# Patient Record
Sex: Female | Born: 1980 | Race: White | Hispanic: No | Marital: Married | Smoking: Current every day smoker
Health system: Southern US, Community
[De-identification: ages and names within clinical notes are randomized; demographics above are authoritative.]

## PROBLEM LIST (undated history)

## (undated) DIAGNOSIS — O43129 Velamentous insertion of umbilical cord, unspecified trimester: Secondary | ICD-10-CM

## (undated) DIAGNOSIS — O24419 Gestational diabetes mellitus in pregnancy, unspecified control: Secondary | ICD-10-CM

## (undated) DIAGNOSIS — Z8619 Personal history of other infectious and parasitic diseases: Secondary | ICD-10-CM

## (undated) HISTORY — DX: Gestational diabetes mellitus in pregnancy, unspecified control: O24.419

## (undated) HISTORY — DX: Personal history of other infectious and parasitic diseases: Z86.19

## (undated) HISTORY — DX: Velamentous insertion of umbilical cord, unspecified trimester: O43.129

## (undated) HISTORY — PX: NO PAST SURGERIES: SHX2092

---

## 2004-10-12 ENCOUNTER — Ambulatory Visit (HOSPITAL_COMMUNITY): Admission: RE | Admit: 2004-10-12 | Discharge: 2004-10-12 | Payer: Self-pay | Admitting: Obstetrics & Gynecology

## 2004-10-12 ENCOUNTER — Ambulatory Visit: Payer: Self-pay | Admitting: Cardiology

## 2008-02-20 ENCOUNTER — Ambulatory Visit (HOSPITAL_COMMUNITY): Admission: RE | Admit: 2008-02-20 | Discharge: 2008-02-20 | Payer: Self-pay | Admitting: Obstetrics

## 2008-02-27 ENCOUNTER — Ambulatory Visit (HOSPITAL_COMMUNITY): Admission: RE | Admit: 2008-02-27 | Discharge: 2008-02-27 | Payer: Self-pay | Admitting: Obstetrics

## 2008-04-02 ENCOUNTER — Ambulatory Visit (HOSPITAL_COMMUNITY): Admission: RE | Admit: 2008-04-02 | Discharge: 2008-04-02 | Payer: Self-pay | Admitting: Obstetrics

## 2008-09-02 ENCOUNTER — Ambulatory Visit: Payer: Self-pay | Admitting: Vascular Surgery

## 2008-09-02 ENCOUNTER — Encounter (INDEPENDENT_AMBULATORY_CARE_PROVIDER_SITE_OTHER): Payer: Self-pay | Admitting: Obstetrics

## 2008-09-02 ENCOUNTER — Ambulatory Visit: Admission: RE | Admit: 2008-09-02 | Discharge: 2008-09-02 | Payer: Self-pay | Admitting: Obstetrics

## 2008-09-16 ENCOUNTER — Inpatient Hospital Stay (HOSPITAL_COMMUNITY): Admission: AD | Admit: 2008-09-16 | Discharge: 2008-09-18 | Payer: Self-pay | Admitting: Obstetrics

## 2010-03-15 IMAGING — US US OB NUCHAL TRANSLUCENCY 1ST GEST
1 series · 11 of 11 positions shown · non-contrast
Comparison: none

OBSTETRICAL ULTRASOUND:
 This ultrasound was performed in The [HOSPITAL], and the [facility] report will be stored to [REDACTED] PACS.

[Series 1: us ob nuchal translucency 1st gest · 11 of 11 slices shown]
[im 1/11]
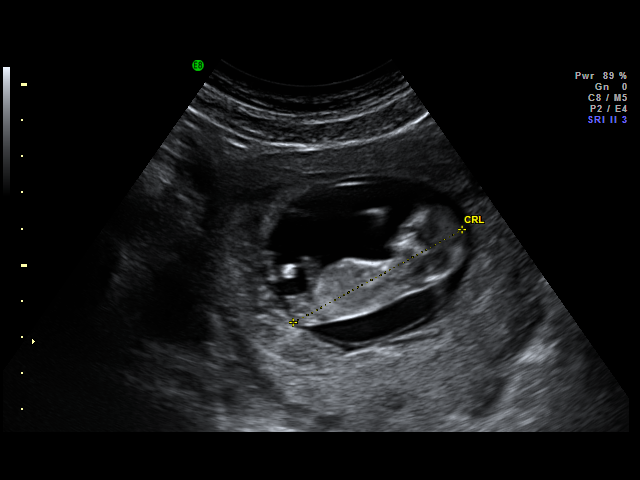
[im 2/11]
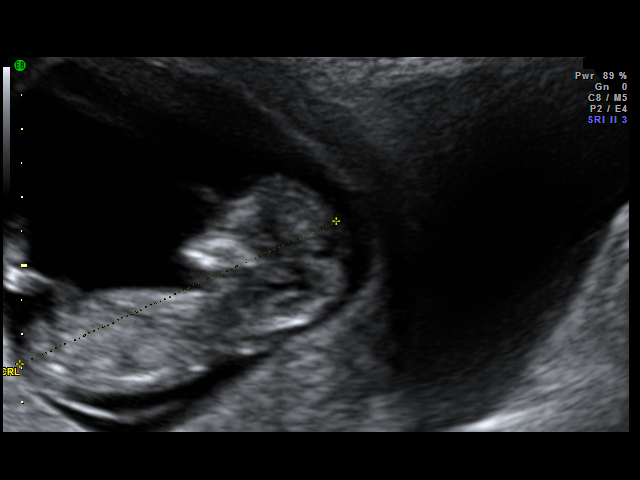
[im 3/11]
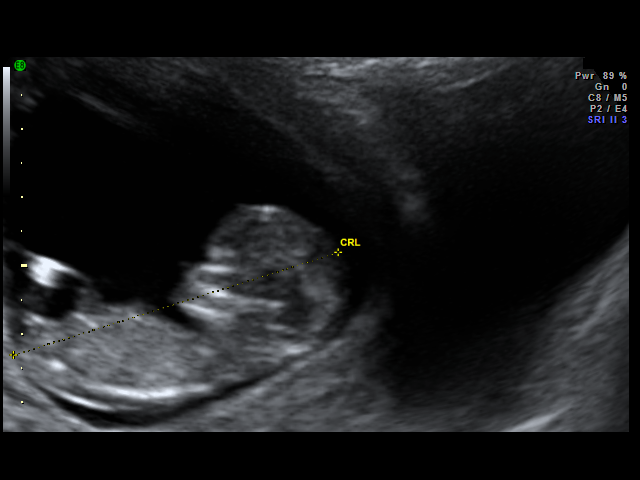
[im 4/11]
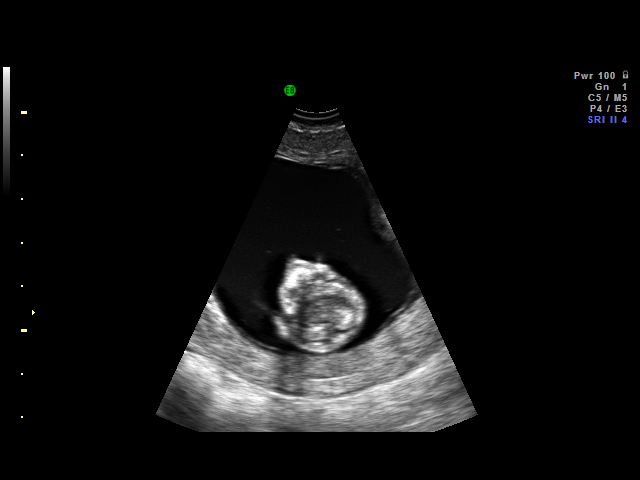
[im 5/11]
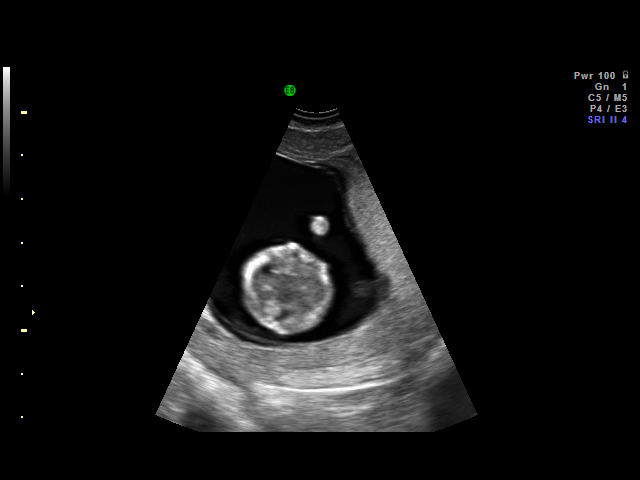
[im 6/11]
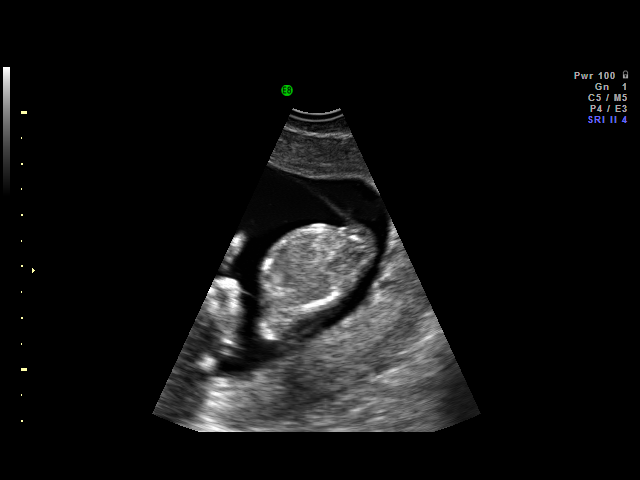
[im 7/11]
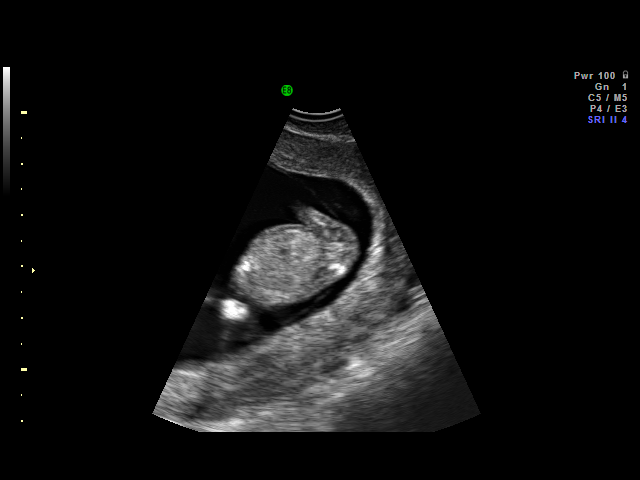
[im 8/11]
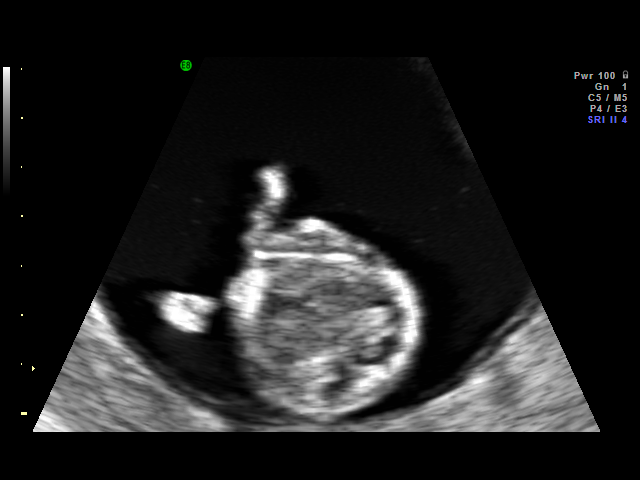
[im 9/11]
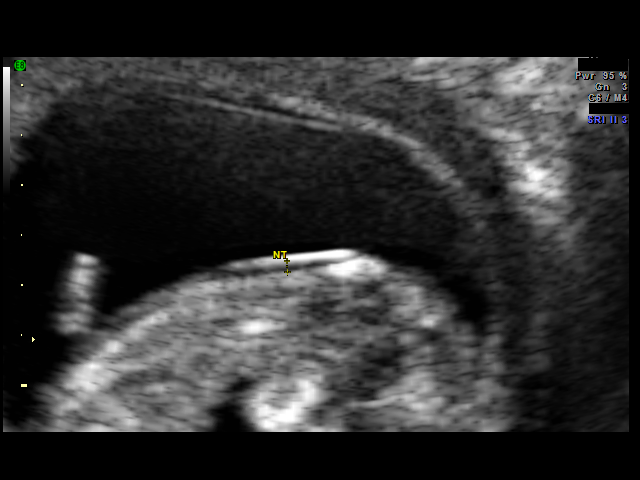
[im 10/11]
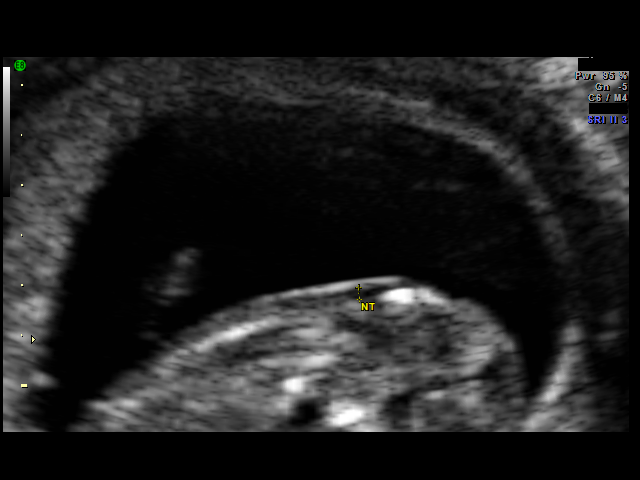
[im 11/11]
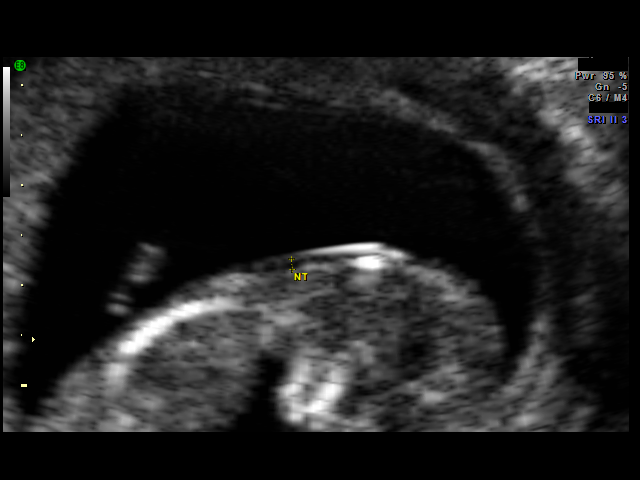

[11 of 11 positions shown; findings below may reference images not displayed]

IMPRESSION: [facility] has also been faxed to the ordering physician.

## 2010-04-17 LAB — CBC
Hemoglobin: 10.9 g/dL — ABNORMAL LOW (ref 12.0–15.0)
MCV: 99.6 fL (ref 78.0–100.0)
Platelets: 172 10*3/uL (ref 150–400)
Platelets: 199 10*3/uL (ref 150–400)
RDW: 12.6 % (ref 11.5–15.5)
RDW: 13.3 % (ref 11.5–15.5)
WBC: 10.8 10*3/uL — ABNORMAL HIGH (ref 4.0–10.5)

## 2010-04-17 LAB — RPR: RPR Ser Ql: NONREACTIVE

## 2010-05-29 NOTE — Procedures (Signed)
NAME:  Kathryn Oconnor, Kathryn Oconnor            ACCOUNT NO.:  [id number]   MEDICAL RECORD NO.:  [id number]          PATIENT TYPE:  OUT   LOCATION:  RAD                           FACILITY:  [facility]   PHYSICIAN:  [place] Bing, M.D. [facility] OF BIRTH:  02/21/1980   DATE OF PROCEDURE:  10/12/2004  DATE OF DISCHARGE:                                  ECHOCARDIOGRAM   CLINICAL DATA:  30 year old with murmur.  M-mode aorta 2.1, left atrium 2.5,  septum 0.9, posterior wall 0.7, LV diastole 3.7, LV systole 2.8.  1.  Technically adequate echocardiographic study.  2.  Normal left atrium, right atrium, and right ventricle.  3.  Normal aortic, mitral, tricuspid, and pulmonic valves.  4.  Normal Doppler study with physiologic tricuspid regurgitation and normal      estimated capital RV systolic pressure.  5.  Normal proximal pulmonary artery.  6.  Normal capital IVC.  7.  Normal internal dimension, wall thickness, regional and global function      of the left ventricle.      [place] Bing, M.D. [facility]  Electronically Signed     RR/MEDQ  D:  10/12/2004  T:  10/12/2004  Job:  [phone]

## 2012-11-16 ENCOUNTER — Other Ambulatory Visit: Payer: Self-pay | Admitting: Nurse Practitioner

## 2012-11-16 ENCOUNTER — Telehealth: Payer: Self-pay | Admitting: Nurse Practitioner

## 2012-11-16 MED ORDER — SULFACETAMIDE SODIUM 10 % OP SOLN: 1.0000 [drp] | OPHTHALMIC | Status: DC

## 2012-11-16 NOTE — Telephone Encounter (Signed)
Discussed with patient

## 2012-11-16 NOTE — Telephone Encounter (Signed)
Med called in. Office visit if worsens or persists.

## 2012-11-16 NOTE — Telephone Encounter (Signed)
Patient needs something called in for pink eye ° °[facility] °

## 2013-01-24 ENCOUNTER — Ambulatory Visit (INDEPENDENT_AMBULATORY_CARE_PROVIDER_SITE_OTHER): Payer: 59 | Admitting: Family Medicine

## 2013-01-24 ENCOUNTER — Encounter: Payer: Self-pay | Admitting: Family Medicine

## 2013-01-24 VITALS — Temp 98.0°F | Ht 63.0 in | Wt 131.0 lb

## 2013-01-24 DIAGNOSIS — J310 Chronic rhinitis: Secondary | ICD-10-CM

## 2013-01-24 DIAGNOSIS — J329 Chronic sinusitis, unspecified: Secondary | ICD-10-CM

## 2013-01-24 DIAGNOSIS — J029 Acute pharyngitis, unspecified: Secondary | ICD-10-CM

## 2013-01-24 MED ORDER — AZITHROMYCIN 250 MG PO TABS: ORAL_TABLET | ORAL | Status: DC

## 2013-01-24 NOTE — Progress Notes (Signed)
   Subjective:    Patient ID: Fransisca Kaufmannrystal Pascuzzi, female    DOB: Mar 05, 1980, 33 y.o.   MRN: [id number]  Fever  This is a new problem. The current episode started today. Associated symptoms include congestion and a sore throat.   Throat tend and sore, slight cough  Not every day  Had a headache today Headache frontal in nature comes and goes.   Flu shot this yr    Review of Systems  Constitutional: Positive for fever.  HENT: Positive for congestion and sore throat.    No vomiting no diarrhea no rash some with recent virus.   ROS otherwise negative Objective:   Physical Exam  Alert mild malaise. Frontal maxillary tenderness pharynx erythematous. Neck supple. Lungs clear. Heart regular in rhythm.      Assessment & Plan:  Impression acute rhinosinusitis plan Zithromax. Symptomatic care discussed. Warning signs discussed. Possible this could be first day of flu, educated on progression of symptoms. WSL

## 2013-01-25 LAB — STREP A DNA PROBE: GASP: NEGATIVE

## 2014-01-11 NOTE — L&D Delivery Note (Signed)
Delivery Note At 4:03 AM a viable and healthy female was delivered via Vaginal, Spontaneous Delivery (Presentation: ;  ).  APGAR: 8, 9; weight  pending.   Placenta status: Intact, Spontaneous.  Cord: 3 vessels with the following complications:30% abruption .  Cord pH: nROA  Anesthesia: Epidural  Episiotomy: na  Lacerations:  second Suture Repair: 2.0 rapide Est. Blood Loss (mL):100    Mom to postpartum.  Baby to Couplet care / Skin to Skin.  Avir Deruiter J 12/22/2014, 4:18 AM

## 2014-02-06 ENCOUNTER — Telehealth: Payer: Self-pay | Admitting: *Deleted

## 2014-02-06 ENCOUNTER — Encounter: Payer: Self-pay | Admitting: Family Medicine

## 2014-02-06 ENCOUNTER — Ambulatory Visit (INDEPENDENT_AMBULATORY_CARE_PROVIDER_SITE_OTHER): Payer: BLUE CROSS/BLUE SHIELD | Admitting: Family Medicine

## 2014-02-06 ENCOUNTER — Ambulatory Visit (HOSPITAL_COMMUNITY)
Admission: RE | Admit: 2014-02-06 | Discharge: 2014-02-06 | Disposition: A | Discharge status: Home / Self Care | Payer: BLUE CROSS/BLUE SHIELD | Source: Ambulatory Visit | Attending: Family Medicine | Admitting: Family Medicine

## 2014-02-06 VITALS — BP 112/80 | Temp 98.6°F | Ht 63.0 in | Wt 128.0 lb

## 2014-02-06 DIAGNOSIS — R079 Chest pain, unspecified: Secondary | ICD-10-CM

## 2014-02-06 DIAGNOSIS — F17200 Nicotine dependence, unspecified, uncomplicated: Secondary | ICD-10-CM | POA: Diagnosis not present

## 2014-02-06 NOTE — Telephone Encounter (Signed)
Left message stating chest xray put in at [facility] d/t chest pain per Dr. Brett CanalesSteve

## 2014-02-06 NOTE — Progress Notes (Signed)
   Subjective:    Patient ID: Kathryn Kaufmannrystal Belgarde, female    DOB: [birth year], 34 y.o.   MRN: [id number]  Chest Pain  This is a new problem. Episode onset: 2 weeks ago. The onset quality is sudden. The problem occurs every several days. The problem has been gradually worsening. The pain is present in the substernal region. The quality of the pain is described as sharp. The pain does not radiate. Associated symptoms include dizziness and near-syncope. The pain is aggravated by nothing. She has tried antacids for the symptoms. The treatment provided mild relief. Risk factors include smoking/tobacco exposure. Past medical history comments: heart mumur   Her family medical history is significant for heart disease.    substernal c p, deep in chest,   No recent bout   No recent injury  [id number] Sleeping ok thru the night  No hx of fainting  Spells  Smokes one ppd on ave  No sig fam h of premature cad, definitely cad in the family. Fa had heart attack recently   Patient does not exercise at all. So unable to ask about exertional association.  No nausea no diaphoresis questionable shortness of breath.  Some nonspecific lightheadedness Review of Systems  Cardiovascular: Positive for chest pain and near-syncope.  Neurological: Positive for dizziness.       Objective:   Physical Exam  Alert no acute distress. Vitals stable. HEENT normal. Lungs clear. Heart regular rate and rhythm. Abdomen benign. Chest wall no obvious tenderness.  EKG normal sinus rhythm no significant ST-T changes    Assessment & Plan:  Impression #1 chest pain, very atypical in nature with considerable anxiety upon the patient's part. Plan trial of Aleve 2 tablets twice a day. Chest x-ray. Cardiology referral. WSL

## 2014-02-06 NOTE — Addendum Note (Signed)
Addended byOneal Deputy: Courtnei Ruddell D on: 02/06/2014 01:46 PM   Modules accepted: Orders

## 2014-02-13 ENCOUNTER — Encounter: Payer: Self-pay | Admitting: Cardiology

## 2014-02-13 ENCOUNTER — Ambulatory Visit (INDEPENDENT_AMBULATORY_CARE_PROVIDER_SITE_OTHER): Payer: BLUE CROSS/BLUE SHIELD | Admitting: Cardiology

## 2014-02-13 VITALS — BP 116/64 | HR 103 | Ht 63.0 in | Wt 126.0 lb

## 2014-02-13 DIAGNOSIS — Z720 Tobacco use: Secondary | ICD-10-CM

## 2014-02-13 DIAGNOSIS — R0789 Other chest pain: Secondary | ICD-10-CM | POA: Insufficient documentation

## 2014-02-13 NOTE — Assessment & Plan Note (Signed)
Smoking cessation recommended 

## 2014-02-13 NOTE — Patient Instructions (Signed)
Your physician recommends that you schedule a follow-up appointment as needed.  Your physician recommends that you continue on your current medications as directed. Please refer to the Current Medication list given to you today.   Thank you for choosing [facility]!!   

## 2014-02-13 NOTE — Assessment & Plan Note (Signed)
Symptoms are potentially indigestion based on description, possibly even esophagitis given more prolonged nature and improvement with antacids. I have recommended that she try an over-the-counter medication such as Prilosec or Pepcid for the next 2 weeks to see if this helps symptoms to completely resolved. Her baseline cardiac risk factor profile is relatively low, her main personal risk being tobacco use. Smoking cessation is warranted. Her recent ECG was essentially normal, and previous echocardiographic assessment of heart murmur revealed no valvular abnormalities. At this point would hold off on any further cardiac ischemic testing since this diagnosis would be relatively unlikely at this time. If symptoms worsen, could always consider a GXT.

## 2014-02-13 NOTE — Progress Notes (Signed)
Cardiology Office Note  Date: 02/13/2014   ID: Fransisca Kaufmann, DOB Feb 07, 1980, MRN [id number]  PCP: Harlow Asa, MD  Primary Cardiologist: Nona Dell, MD   Chief Complaint  Patient presents with  . Chest Pain    History of Present Illness: Kathryn Oconnor is a 34 y.o. female referred for cardiology consultation by Dr. Gerda Diss. She tells me that she has had 2 episodes of a dull chest discomfort, felt that it was likely indigestion. One of these episodes lasted for several hours and was more throbbing. She woke up early morning with these symptoms, drank milk and took Tums, and eventually the symptoms resolved. She has not had any previous problems with indigestion, and does not take an antacid regularly. Symptoms have not been exertional. She has been functional in her typical ADLs, works in an assisted-living environment.  She has no major personal cardiac risk factors other than history of tobacco use. Family history includes heart disease in her father in his 27s, also in a maternal uncle in his 41s.  She reports being told that she had a heart murmur by her gynecologist. several years ago and did have an echocardiogram in 2006. The study reported normal left and right ventricular function with no major valvular abnormalities.  Recent ECG noted below.  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 She states that her routine has been altered, was wondering whether her symptoms might also be related.   History reviewed. No pertinent past medical history.  History reviewed. No pertinent past surgical history.  Current Outpatient Prescriptions  Medication Sig Dispense Refill  . naproxen sodium (ANAPROX) 220 MG tablet Take 220 mg by mouth as needed.     No current facility-administered medications for this visit.    Allergies:  Review of patient's allergies indicates  no known allergies.   Social History: The patient  reports that she has been smoking Cigarettes.  She started smoking about 17 years ago. She has been smoking about 1.00 pack per day. She has never used smokeless tobacco. She reports that she does not drink alcohol or use illicit drugs.   Family History: The patient's family history includes CAD in her father and maternal uncle.   ROS:  Please see the history of present illness. Otherwise, complete review of systems is positive for none.  All other systems are reviewed and negative.    PHYSICAL EXAM: VS:  BP 116/64 mmHg  Pulse 103  Ht  (1.6 m)  Wt 126 lb (57.153 kg)  BMI 22.33 kg/m2  SpO2 97%  LMP 01/22/2014, BMI Body mass index is 22.33 kg/(m^2).  Wt Readings from Last 3 Encounters:  02/13/14 126 lb (57.153 kg)  02/06/14 128 lb (58.06 kg)  01/24/13 131 lb (59.421 kg)    Normally nourished appearing young woman in no distress. HEENT: Conjunctiva and lids normal, oropharynx clear with moist mucosa. Neck: Supple, no elevated JVP or carotid bruits, no thyromegaly. Lungs: Clear to auscultation, nonlabored breathing at rest. Cardiac: Regular rate and rhythm, no S3, soft systolic murmur, no pericardial rub. Abdomen: Soft, nontender, bowel sounds present. Extremities: No pitting edema, distal pulses 2+. Skin: Warm and dry. Musculoskeletal: No kyphosis. Neuropsychiatric: Alert and oriented x3, affect grossly appropriate.   ECG: ECG is not ordered today demonstrates  Recent Labwork: None  Other Studies Reviewed Today:  1. Recent ECG from  January 2016 reviewed finding normal sinus rhythm.  2. EXAM: CHEST 2 VIEW 02/07/2014  COMPARISON: None.  FINDINGS: Cardiomediastinal silhouette is unremarkable. Lower thoracic dextroscoliosis. No acute infiltrate or pleural effusion. No pulmonary edema. Mild hyperinflation.  IMPRESSION: No active disease. Mild hyperinflation. Lower thoracic Mild Dextroscoliosis.  3.  Echocardiogram 10/12/2004 CLINICAL DATA: 34 year old with murmur. M-mode aorta 2.1, left atrium 2.5, septum 0.9, posterior wall 0.7, LV diastole 3.7, LV systole 2.8. 1. Technically adequate echocardiographic study. 2. Normal left atrium, right atrium, and right ventricle. 3. Normal aortic, mitral, tricuspid, and pulmonic valves. 4. Normal Doppler study with physiologic tricuspid regurgitation and normal  estimated capital RV systolic pressure. 5. Normal proximal pulmonary artery. 6. Normal capital IVC. 7. Normal internal dimension, wall thickness, regional and global function  of the left ventricle.   ASSESSMENT AND PLAN:  Atypical chest pain Symptoms are potentially indigestion based on description, possibly even esophagitis given more prolonged nature and improvement with antacids. I have recommended that she try an over-the-counter medication such as Prilosec or Pepcid for the next 2 weeks to see if this helps symptoms to completely resolved. Her baseline cardiac risk factor profile is relatively low, her main personal risk being tobacco use. Smoking cessation is warranted. Her recent ECG was essentially normal, and previous echocardiographic assessment of heart murmur revealed no valvular abnormalities. At this point would hold off on any further cardiac ischemic testing since this diagnosis would be relatively unlikely at this time. If symptoms worsen, could always consider a GXT.   Tobacco abuse Smoking cessation recommended.     Current medicines are reviewed at length with the patient today.  The patient does not have concerns regarding medicines.   Disposition: FU as needed.  Signed, Jonelle SidleSamuel G. McDowell, MD, [facility]ACC 02/13/2014 2:06 PM    [facility] [address], [place], [place] [address] Phone: [phone]) [phone]; Fax: [phone]) [phone]

## 2014-02-15 ENCOUNTER — Encounter: Payer: Self-pay | Admitting: Cardiology

## 2014-02-15 ENCOUNTER — Encounter: Payer: Self-pay | Admitting: Family Medicine

## 2014-05-23 LAB — OB RESULTS CONSOLE ABO/RH: RH Type: POSITIVE

## 2014-05-23 LAB — OB RESULTS CONSOLE GC/CHLAMYDIA
CHLAMYDIA, DNA PROBE: NEGATIVE
Gonorrhea: NEGATIVE

## 2014-05-23 LAB — OB RESULTS CONSOLE RUBELLA ANTIBODY, IGM: RUBELLA: IMMUNE

## 2014-05-23 LAB — OB RESULTS CONSOLE ANTIBODY SCREEN: Antibody Screen: NEGATIVE

## 2014-05-23 LAB — OB RESULTS CONSOLE RPR: RPR: NONREACTIVE

## 2014-05-23 LAB — OB RESULTS CONSOLE HIV ANTIBODY (ROUTINE TESTING): HIV: NONREACTIVE

## 2014-05-23 LAB — OB RESULTS CONSOLE HEPATITIS B SURFACE ANTIGEN: HEP B S AG: NEGATIVE

## 2014-10-23 ENCOUNTER — Encounter: Payer: BLUE CROSS/BLUE SHIELD | Attending: Obstetrics

## 2014-10-23 VITALS — Ht 63.0 in | Wt 138.3 lb

## 2014-10-23 DIAGNOSIS — Z713 Dietary counseling and surveillance: Secondary | ICD-10-CM | POA: Diagnosis not present

## 2014-10-23 DIAGNOSIS — O24419 Gestational diabetes mellitus in pregnancy, unspecified control: Secondary | ICD-10-CM | POA: Insufficient documentation

## 2014-10-29 NOTE — Progress Notes (Signed)
  Patient was seen on 10/23/14 for Gestational Diabetes self-management . The following learning objectives were met by the patient :   States the definition of Gestational Diabetes  States why dietary management is important in controlling blood glucose  Describes the effects of carbohydrates on blood glucose levels  Demonstrates ability to create a balanced meal plan  Demonstrates carbohydrate counting   States when to check blood glucose levels  Demonstrates proper blood glucose monitoring techniques  States the effect of stress and exercise on blood glucose levels  States the importance of limiting caffeine and abstaining from alcohol and smoking  Plan:  Aim for 2 Carb Choices per meal (30 grams) +/- 1 either way for breakfast Aim for 3 Carb Choices per meal (45 grams) +/- 1 either way from lunch and dinner Aim for 1-2 Carbs per snack Begin reading food labels for Total Carbohydrate and sugar grams of foods Consider  increasing your activity level by walking daily as tolerated Begin checking BG before breakfast and 1-2 hours after first bit of breakfast, lunch and dinner after  as directed by MD  Take medication  as directed by MD  Blood glucose monitor given: Testing per MD office  Patient instructed to monitor glucose levels: FBS: 60 - <90 1 hour: <140 2 hour: <120  Patient received the following handouts:  Nutrition Diabetes and Pregnancy  Carbohydrate Counting List  Meal Planning worksheet  Patient will be seen for follow-up as needed.

## 2014-11-25 ENCOUNTER — Ambulatory Visit (INDEPENDENT_AMBULATORY_CARE_PROVIDER_SITE_OTHER): Payer: BLUE CROSS/BLUE SHIELD | Admitting: Family Medicine

## 2014-11-25 ENCOUNTER — Encounter: Payer: Self-pay | Admitting: Family Medicine

## 2014-11-25 VITALS — Temp 98.6°F | Ht 63.0 in | Wt 140.0 lb

## 2014-11-25 DIAGNOSIS — J019 Acute sinusitis, unspecified: Secondary | ICD-10-CM | POA: Diagnosis not present

## 2014-11-25 DIAGNOSIS — B9689 Other specified bacterial agents as the cause of diseases classified elsewhere: Secondary | ICD-10-CM

## 2014-11-25 MED ORDER — AMOXICILLIN 500 MG PO TABS: 500.0000 mg | ORAL_TABLET | Freq: Three times a day (TID) | ORAL | Status: DC

## 2014-11-25 NOTE — Progress Notes (Signed)
   Subjective:    Patient ID: Kathryn Oconnor, female    DOB: 1980-11-13, 34 y.o.   MRN: [id number]  Pt is [redacted] weeks pregnant.  Sore Throat  This is a new problem. Episode onset: 3 days ago. Associated symptoms include congestion, coughing and ear pain. Pertinent negatives include no shortness of breath. Associated symptoms comments: Ear pain, runny nose. Treatments tried: mucinex, chloreseptic, cough drops.    Patient with head congestion drainage coughing sinus pressure not feeling good symptoms been present over the course of the past several days worse over the past few days. PMH benign  Review of Systems  Constitutional: Negative for fever and activity change.  HENT: Positive for congestion, ear pain and rhinorrhea.   Eyes: Negative for discharge.  Respiratory: Positive for cough. Negative for shortness of breath and wheezing.   Cardiovascular: Negative for chest pain.       Objective:   Physical Exam  Constitutional: She appears well-developed.  HENT:  Head: Normocephalic.  Nose: Nose normal.  Mouth/Throat: Oropharynx is clear and moist. No oropharyngeal exudate.  Neck: Neck supple.  Cardiovascular: Normal rate and normal heart sounds.   No murmur heard. Pulmonary/Chest: Effort normal and breath sounds normal. She has no wheezes.  Lymphadenopathy:    She has no cervical adenopathy.  Skin: Skin is warm and dry.  Nursing note and vitals reviewed.         Assessment & Plan:  Viral syndrome secondary rhinosinusitis antibiotics prescribed. Patient is [redacted] weeks pregnant but I find no evidence of any type underlying issue complicating this from her infection. She was told if she starts running high fevers excessive vomiting or getting worse she would need immediate recheck here or ER. She should gradually get better over the course of next several days

## 2014-12-09 LAB — OB RESULTS CONSOLE GBS: GBS: POSITIVE

## 2014-12-18 ENCOUNTER — Encounter (HOSPITAL_COMMUNITY): Payer: Self-pay | Admitting: *Deleted

## 2014-12-18 ENCOUNTER — Telehealth (HOSPITAL_COMMUNITY): Payer: Self-pay | Admitting: *Deleted

## 2014-12-18 NOTE — Telephone Encounter (Signed)
Preadmission screen  

## 2014-12-19 ENCOUNTER — Other Ambulatory Visit: Payer: Self-pay | Admitting: Obstetrics

## 2014-12-22 ENCOUNTER — Inpatient Hospital Stay (HOSPITAL_COMMUNITY): Payer: BLUE CROSS/BLUE SHIELD | Admitting: Anesthesiology

## 2014-12-22 ENCOUNTER — Inpatient Hospital Stay (HOSPITAL_COMMUNITY)
Admission: AD | Admit: 2014-12-22 | Discharge: 2014-12-24 | DRG: 775 | Disposition: A | Discharge status: Home / Self Care | Payer: BLUE CROSS/BLUE SHIELD | Source: Ambulatory Visit | Attending: Obstetrics and Gynecology | Admitting: Obstetrics and Gynecology

## 2014-12-22 ENCOUNTER — Encounter (HOSPITAL_COMMUNITY): Payer: Self-pay | Admitting: *Deleted

## 2014-12-22 DIAGNOSIS — K219 Gastro-esophageal reflux disease without esophagitis: Secondary | ICD-10-CM | POA: Diagnosis present

## 2014-12-22 DIAGNOSIS — Z3A39 39 weeks gestation of pregnancy: Secondary | ICD-10-CM | POA: Diagnosis not present

## 2014-12-22 DIAGNOSIS — O99824 Streptococcus B carrier state complicating childbirth: Secondary | ICD-10-CM | POA: Diagnosis present

## 2014-12-22 DIAGNOSIS — Z8249 Family history of ischemic heart disease and other diseases of the circulatory system: Secondary | ICD-10-CM

## 2014-12-22 DIAGNOSIS — Z818 Family history of other mental and behavioral disorders: Secondary | ICD-10-CM

## 2014-12-22 DIAGNOSIS — O24429 Gestational diabetes mellitus in childbirth, unspecified control: Secondary | ICD-10-CM | POA: Diagnosis present

## 2014-12-22 DIAGNOSIS — O9962 Diseases of the digestive system complicating childbirth: Secondary | ICD-10-CM | POA: Diagnosis present

## 2014-12-22 DIAGNOSIS — O99334 Smoking (tobacco) complicating childbirth: Secondary | ICD-10-CM | POA: Diagnosis present

## 2014-12-22 DIAGNOSIS — F17210 Nicotine dependence, cigarettes, uncomplicated: Secondary | ICD-10-CM | POA: Diagnosis present

## 2014-12-22 LAB — CBC
HCT: 38.3 % (ref 36.0–46.0)
HCT: 34.7 % — ABNORMAL LOW (ref 36.0–46.0)
Hemoglobin: 13 g/dL (ref 12.0–15.0)
Hemoglobin: 11.7 g/dL — ABNORMAL LOW (ref 12.0–15.0)
MCH: 31.6 pg (ref 26.0–34.0)
MCH: 31.5 pg (ref 26.0–34.0)
MCHC: 33.9 g/dL (ref 30.0–36.0)
MCHC: 33.7 g/dL (ref 30.0–36.0)
MCV: 93.2 fL (ref 78.0–100.0)
MCV: 93.5 fL (ref 78.0–100.0)
PLATELETS: 261 10*3/uL (ref 150–400)
Platelets: 210 10*3/uL (ref 150–400)
RBC: 4.11 MIL/uL (ref 3.87–5.11)
RBC: 3.71 MIL/uL — ABNORMAL LOW (ref 3.87–5.11)
RDW: 13.7 % (ref 11.5–15.5)
RDW: 13.7 % (ref 11.5–15.5)
WBC: 13.9 10*3/uL — ABNORMAL HIGH (ref 4.0–10.5)
WBC: 16.4 10*3/uL — ABNORMAL HIGH (ref 4.0–10.5)

## 2014-12-22 LAB — RPR: RPR Ser Ql: NONREACTIVE

## 2014-12-22 LAB — ABO/RH: ABO/RH(D): A POS

## 2014-12-22 LAB — TYPE AND SCREEN
ABO/RH(D): A POS
Antibody Screen: NEGATIVE

## 2014-12-22 MED ORDER — METHYLERGONOVINE MALEATE 0.2 MG PO TABS: 0.2000 mg | ORAL_TABLET | ORAL | Status: DC | PRN

## 2014-12-22 MED ORDER — PRENATAL MULTIVITAMIN CH
1.0000 | ORAL_TABLET | Freq: Every day | ORAL | Status: DC
  Administered 2014-12-22 – 2014-12-23 (×2): 1 via ORAL
  Filled 2014-12-22 (×2): qty 1

## 2014-12-22 MED ORDER — IBUPROFEN 600 MG PO TABS
600.0000 mg | ORAL_TABLET | Freq: Four times a day (QID) | ORAL | Status: DC
  Administered 2014-12-22 – 2014-12-24 (×9): 600 mg via ORAL
  Filled 2014-12-22 (×9): qty 1

## 2014-12-22 MED ORDER — PHENYLEPHRINE 40 MCG/ML (10ML) SYRINGE FOR IV PUSH (FOR BLOOD PRESSURE SUPPORT)
80.0000 ug | PREFILLED_SYRINGE | INTRAVENOUS | Status: DC | PRN
  Filled 2014-12-22: qty 2

## 2014-12-22 MED ORDER — BENZOCAINE-MENTHOL 20-0.5 % EX AERO
1.0000 "application " | INHALATION_SPRAY | CUTANEOUS | Status: DC | PRN
  Administered 2014-12-23: 1 via TOPICAL
  Filled 2014-12-22: qty 56

## 2014-12-22 MED ORDER — DIBUCAINE 1 % RE OINT: 1.0000 "application " | TOPICAL_OINTMENT | RECTAL | Status: DC | PRN

## 2014-12-22 MED ORDER — ACETAMINOPHEN 325 MG PO TABS: 650.0000 mg | ORAL_TABLET | ORAL | Status: DC | PRN

## 2014-12-22 MED ORDER — CITRIC ACID-SODIUM CITRATE 334-500 MG/5ML PO SOLN: 30.0000 mL | ORAL | Status: DC | PRN

## 2014-12-22 MED ORDER — FLEET ENEMA 7-19 GM/118ML RE ENEM: 1.0000 | ENEMA | RECTAL | Status: DC | PRN

## 2014-12-22 MED ORDER — ONDANSETRON HCL 4 MG/2ML IJ SOLN: 4.0000 mg | INTRAMUSCULAR | Status: DC | PRN

## 2014-12-22 MED ORDER — METHYLERGONOVINE MALEATE 0.2 MG/ML IJ SOLN: 0.2000 mg | INTRAMUSCULAR | Status: DC | PRN

## 2014-12-22 MED ORDER — FENTANYL 2.5 MCG/ML BUPIVACAINE 1/10 % EPIDURAL INFUSION (WH - ANES)
INTRAMUSCULAR | Status: AC
  Filled 2014-12-22: qty 125

## 2014-12-22 MED ORDER — LACTATED RINGERS IV SOLN
500.0000 mL | INTRAVENOUS | Status: DC | PRN
  Administered 2014-12-22: 500 mL via INTRAVENOUS

## 2014-12-22 MED ORDER — PHENYLEPHRINE 40 MCG/ML (10ML) SYRINGE FOR IV PUSH (FOR BLOOD PRESSURE SUPPORT)
PREFILLED_SYRINGE | INTRAVENOUS | Status: AC
  Filled 2014-12-22: qty 20

## 2014-12-22 MED ORDER — LIDOCAINE HCL (PF) 1 % IJ SOLN
INTRAMUSCULAR | Status: DC | PRN
  Administered 2014-12-22 (×2): 4 mL via EPIDURAL

## 2014-12-22 MED ORDER — SODIUM CHLORIDE 0.9 % IV SOLN
2.0000 g | Freq: Once | INTRAVENOUS | Status: AC
  Administered 2014-12-22: 2 g via INTRAVENOUS
  Filled 2014-12-22: qty 2000

## 2014-12-22 MED ORDER — OXYCODONE-ACETAMINOPHEN 5-325 MG PO TABS: 2.0000 | ORAL_TABLET | ORAL | Status: DC | PRN

## 2014-12-22 MED ORDER — OXYTOCIN 40 UNITS IN LACTATED RINGERS INFUSION - SIMPLE MED
62.5000 mL/h | INTRAVENOUS | Status: DC
  Administered 2014-12-22: 62.5 mL/h via INTRAVENOUS
  Filled 2014-12-22: qty 1000

## 2014-12-22 MED ORDER — DIPHENHYDRAMINE HCL 25 MG PO CAPS: 25.0000 mg | ORAL_CAPSULE | Freq: Four times a day (QID) | ORAL | Status: DC | PRN

## 2014-12-22 MED ORDER — OXYTOCIN BOLUS FROM INFUSION: 500.0000 mL | INTRAVENOUS | Status: DC

## 2014-12-22 MED ORDER — TETANUS-DIPHTH-ACELL PERTUSSIS 5-2.5-18.5 LF-MCG/0.5 IM SUSP: 0.5000 mL | Freq: Once | INTRAMUSCULAR | Status: DC

## 2014-12-22 MED ORDER — ONDANSETRON HCL 4 MG/2ML IJ SOLN: 4.0000 mg | Freq: Four times a day (QID) | INTRAMUSCULAR | Status: DC | PRN

## 2014-12-22 MED ORDER — LACTATED RINGERS IV SOLN: INTRAVENOUS | Status: DC

## 2014-12-22 MED ORDER — EPHEDRINE 5 MG/ML INJ
10.0000 mg | INTRAVENOUS | Status: DC | PRN
  Filled 2014-12-22: qty 2

## 2014-12-22 MED ORDER — FENTANYL 2.5 MCG/ML BUPIVACAINE 1/10 % EPIDURAL INFUSION (WH - ANES)
14.0000 mL/h | INTRAMUSCULAR | Status: DC | PRN
  Administered 2014-12-22: 14 mL/h via EPIDURAL

## 2014-12-22 MED ORDER — ONDANSETRON HCL 4 MG PO TABS: 4.0000 mg | ORAL_TABLET | ORAL | Status: DC | PRN

## 2014-12-22 MED ORDER — SIMETHICONE 80 MG PO CHEW: 80.0000 mg | CHEWABLE_TABLET | ORAL | Status: DC | PRN

## 2014-12-22 MED ORDER — WITCH HAZEL-GLYCERIN EX PADS: 1.0000 "application " | MEDICATED_PAD | CUTANEOUS | Status: DC | PRN

## 2014-12-22 MED ORDER — LANOLIN HYDROUS EX OINT: TOPICAL_OINTMENT | CUTANEOUS | Status: DC | PRN

## 2014-12-22 MED ORDER — OXYCODONE-ACETAMINOPHEN 5-325 MG PO TABS: 1.0000 | ORAL_TABLET | ORAL | Status: DC | PRN

## 2014-12-22 MED ORDER — DIPHENHYDRAMINE HCL 50 MG/ML IJ SOLN: 12.5000 mg | INTRAMUSCULAR | Status: DC | PRN

## 2014-12-22 MED ORDER — ZOLPIDEM TARTRATE 5 MG PO TABS: 5.0000 mg | ORAL_TABLET | Freq: Every evening | ORAL | Status: DC | PRN

## 2014-12-22 MED ORDER — SENNOSIDES-DOCUSATE SODIUM 8.6-50 MG PO TABS
2.0000 | ORAL_TABLET | ORAL | Status: DC
  Administered 2014-12-22 – 2014-12-24 (×2): 2 via ORAL
  Filled 2014-12-22 (×2): qty 2

## 2014-12-22 MED ORDER — LIDOCAINE HCL (PF) 1 % IJ SOLN
30.0000 mL | INTRAMUSCULAR | Status: AC | PRN
  Administered 2014-12-22: 30 mL via SUBCUTANEOUS
  Filled 2014-12-22: qty 30

## 2014-12-22 NOTE — MAU Note (Signed)
Contractions all day off and on. More consistent since 2200. Some bloody show.

## 2014-12-22 NOTE — H&P (Signed)
Kathryn Oconnor is a 34 y.o. female presenting for labor. Maternal Medical History:  Reason for admission: Contractions.   Contractions: Onset was less than 1 hour ago.    Fetal activity: Perceived fetal activity is normal.    Prenatal complications: no prenatal complications Prenatal Complications - Diabetes: none.    OB History    Gravida Para Term Preterm AB TAB SAB Ectopic Multiple Living   2 1 1       1      Past Medical History  Diagnosis Date  . Gestational diabetes mellitus (GDM), antepartum   . Hx of varicella   . Velamentous insertion of umbilical cord   . Gestational diabetes     diet controlled   Past Surgical History  Procedure Laterality Date  . No past surgeries     Family History: family history includes Bipolar disorder in her brother; CAD in her father and maternal uncle; Cancer in her maternal grandfather; Heart disease in her father; Hypertension in her father and maternal grandmother; Migraines in her mother. Social History:  reports that she has been smoking Cigarettes.  She started smoking about 18 years ago. She has been smoking about 0.50 packs per day. She has never used smokeless tobacco. She reports that she does not drink alcohol or use illicit drugs.   Prenatal Transfer Tool  Maternal Diabetes: No Genetic Screening: Normal Maternal Ultrasounds/Referrals: Normal Fetal Ultrasounds or other Referrals:  None Maternal Substance Abuse:  No Significant Maternal Medications:  None Significant Maternal Lab Results:  Lab values include: Group B Strep positive Other Comments:  None  Review of Systems  Constitutional: Negative.   All other systems reviewed and are negative.   Dilation: Lip/rim Effacement (%): 100 Station: -2 Exam by:: Lbanks, RN Blood pressure 120/68, pulse 93, temperature 98 F (36.7 C), temperature source Axillary, resp. rate 18, height 5\' 4"  (1.626 m), weight 64.683 kg (142 lb 9.6 oz), last menstrual period 01/22/2014, SpO2  100 %. Maternal Exam:  Uterine Assessment: Contraction strength is moderate.  Contraction frequency is irregular.   Abdomen: Patient reports no abdominal tenderness. Fetal presentation: vertex  Introitus: Normal vulva. Normal vagina.  Ferning test: negative.  Nitrazine test: negative. Amniotic fluid character: not assessed.  Pelvis: adequate for delivery.   Cervix: Cervix evaluated by digital exam.     Physical Exam  Nursing note and vitals reviewed. Constitutional: She appears well-developed and well-nourished.  Cardiovascular: Normal rate and regular rhythm.   Respiratory: Effort normal and breath sounds normal.  GI: Soft. Bowel sounds are normal.  Genitourinary: Vagina normal and uterus normal.    Prenatal labs: ABO, Rh: --/--/A POS (12/11 0239) Antibody: NEG (12/11 0239) Rubella: Immune (05/12 0000) RPR: Nonreactive (05/12 0000)  HBsAg: Negative (05/12 0000)  HIV: Non-reactive (05/12 0000)  GBS: Positive (11/28 0000)   Assessment/Plan: Active labor Marginal CI Admit GBS prophylaxis   Kathryn Oconnor J 12/22/2014, 3:47 AM

## 2014-12-22 NOTE — Lactation Note (Signed)
This note was copied from the chart of Kathryn Oconnor. Lactation Consultation Note  Patient Name: Kathryn Oconnor Today's Date: 12/22/2014 Reason for consult: Initial assessment Mom reports baby is nursing well, denies questions/concerns. Encouraged to continue to BF with feeding ques, cluster feeding discussed. Lactation brochure left for review, advised of OP services and support group. Baby STS at this visit, encouraged Mom to call if she would like assist.   Maternal Data Has patient been taught Hand Expression?: No (Mom experienced BF and declined assist w/hand expression)  Feeding    LATCH Score/Interventions                      Lactation Tools Discussed/Used WIC Program: No   Consult Status Consult Status: PRN Date: 12/23/14 Follow-up type: In-patient    Alfred LevinsGranger, Kathryn Oconnor 12/22/2014, 6:07 PM

## 2014-12-22 NOTE — Anesthesia Postprocedure Evaluation (Signed)
Anesthesia Post Note  Patient: Fransisca Kaufmannrystal Maricle  Procedure(s) Performed: * No procedures listed *  Patient location during evaluation: Mother Baby Anesthesia Type: Epidural Level of consciousness: awake and alert and oriented Pain management: pain level controlled Vital Signs Assessment: post-procedure vital signs reviewed and stable Respiratory status: respiratory function stable Cardiovascular status: blood pressure returned to baseline Postop Assessment: no headache, no backache, patient able to bend at knees, no signs of nausea or vomiting and adequate PO intake Anesthetic complications: no    Last Vitals:  Filed Vitals:   12/22/14 0639 12/22/14 0738  BP: 113/62 118/93  Pulse: 77 73  Temp: 36.9 C 36.8 C  Resp: 18 18    Last Pain:  Filed Vitals:   12/22/14 0810  PainSc: 0-No pain                 Tagg Eustice

## 2014-12-22 NOTE — Anesthesia Procedure Notes (Signed)
Epidural Patient location during procedure: OB Start time: 12/22/2014 3:21 AM  Staffing Anesthesiologist: Mal AmabileFOSTER, Safia Panzer Performed by: anesthesiologist   Preanesthetic Checklist Completed: patient identified, site marked, surgical consent, pre-op evaluation, timeout performed, IV checked, risks and benefits discussed and monitors and equipment checked  Epidural Patient position: sitting Prep: site prepped and draped and DuraPrep Patient monitoring: continuous pulse ox and blood pressure Approach: midline Location: L3-L4 Injection technique: LOR air  Needle:  Needle type: Tuohy  Needle gauge: 17 G Needle length: 9 cm and 9 Needle insertion depth: 5 cm cm Catheter type: closed end flexible Catheter size: 19 Gauge Catheter at skin depth: 10 cm Test dose: negative and Other  Assessment Events: blood not aspirated, injection not painful, no injection resistance, negative IV test and no paresthesia  Additional Notes Patient identified. Risks and benefits discussed including failed block, incomplete  Pain control, post dural puncture headache, nerve damage, paralysis, blood pressure Changes, nausea, vomiting, reactions to medications-both toxic and allergic and post Partum back pain. All questions were answered. Patient expressed understanding and wished to proceed. Sterile technique was used throughout procedure. Epidural site was Dressed with sterile barrier dressing. No paresthesias, signs of intravascular injection Or signs of intrathecal spread were encountered.  Patient was more comfortable after the epidural was dosed. Please see RN's note for documentation of vital signs and FHR which are stable.

## 2014-12-22 NOTE — Anesthesia Preprocedure Evaluation (Signed)
Anesthesia Evaluation  Patient identified by MRN, date of birth, ID band Patient awake    Reviewed: Allergy & Precautions, H&P , Patient's Chart, lab work & pertinent test results  Airway Mallampati: III  TM Distance: >3 FB Neck ROM: full    Dental no notable dental hx. (+) Teeth Intact   Pulmonary neg pulmonary ROS, Current Smoker,    Pulmonary exam normal breath sounds clear to auscultation       Cardiovascular negative cardio ROS Normal cardiovascular exam Rhythm:regular Rate:Normal     Neuro/Psych negative neurological ROS  negative psych ROS   GI/Hepatic negative GI ROS, Neg liver ROS, GERD  Medicated,  Endo/Other  negative endocrine ROSdiabetes  Renal/GU negative Renal ROS  negative genitourinary   Musculoskeletal   Abdominal   Peds  Hematology negative hematology ROS (+)   Anesthesia Other Findings   Reproductive/Obstetrics (+) Pregnancy                             Anesthesia Physical Anesthesia Plan  ASA: II  Anesthesia Plan: Epidural   Post-op Pain Management:    Induction:   Airway Management Planned:   Additional Equipment:   Intra-op Plan:   Post-operative Plan:   Informed Consent: I have reviewed the patients History and Physical, chart, labs and discussed the procedure including the risks, benefits and alternatives for the proposed anesthesia with the patient or authorized representative who has indicated his/her understanding and acceptance.     Plan Discussed with: Anesthesiologist  Anesthesia Plan Comments:         Anesthesia Quick Evaluation

## 2014-12-23 NOTE — Progress Notes (Signed)
PPD #1- SVD  Subjective:   Reports feeling well Tolerating po/ No nausea or vomiting Bleeding is light Pain controlled with Motrin Up ad lib / ambulatory / voiding without problems Newborn: brestfeeding    Objective:   VS:  VS:  Filed Vitals:   12/22/14 0738 12/22/14 1045 12/22/14 1839 12/23/14 0700  BP: 118/93 126/90 116/64 115/77  Pulse: 73 68 70 61  Temp: 98.2 F (36.8 C) 98.4 F (36.9 C) 98.3 F (36.8 C) 98.4 F (36.9 C)  TempSrc: Oral Oral Oral   Resp: 18 18 18 18   Height:      Weight:      SpO2:        LABS:  Recent Labs  12/22/14 0239 12/22/14 0658  WBC 13.9* 16.4*  HGB 13.0 11.7*  PLT 261 210   Blood type: --/--/A POS, A POS (12/11 0239) Rubella: Immune (05/12 0000)   I&O: Intake/Output      12/11 0701 - 12/12 0700 12/12 0701 - 12/13 0700   Blood     Total Output       Net            Urine Occurrence 2 x      Physical Exam: Alert and oriented x3 Abdomen: soft, non-tender, non-distended  Fundus: firm, non-tender, U-3 Perineum: Well approximated, no significant erythema, edema, or drainage; healing well. Lochia: small Extremities: No edema, no calf pain or tenderness   Assessment:  PPD #1 G2P2002/ S/P:spontaneous vaginal, 2nd degree laceration  Doing well   Plan: Continue routine post partum orders Anticipate D/C home tomorrow   Donette LarryBHAMBRI, Kayline Sheer, N MSN, CNM 12/23/2014, 9:51 AM

## 2014-12-23 NOTE — Lactation Note (Signed)
This note was copied from the chart of Kathryn Fransisca Kaufmannrystal Hellen. Lactation Consultation Note  Follow up note.  Mom states baby is nursing well.  No concerns or questions at present.  Encouraged to call if concerns arise or assist needed.  Patient Name: Kathryn Oconnor Today's Date: 12/23/2014     Maternal Data    Feeding    LATCH Score/Interventions                      Lactation Tools Discussed/Used     Consult Status      Huston FoleyMOULDEN, [name] 12/23/2014, 2:14 PM

## 2014-12-24 MED ORDER — IBUPROFEN 600 MG PO TABS: 600.0000 mg | ORAL_TABLET | Freq: Four times a day (QID) | ORAL | Status: DC

## 2014-12-24 NOTE — Progress Notes (Signed)
Patient ID: Kathryn Oconnor, female   DOB: [birth year], 34 y.o.   MRN: [id number] Post Partum Day #2            Information for the patient's newborn:  Kathryn Oconnor, Girl Kathryn Oconnor [[id number]][[id number]]  female  Feeding: breast  Subjective: No HA, SOB, CP, F/C, breast symptoms. Pain well-controlled with ibuprofen. Normal vaginal bleeding, no clots.      Objective:  Temp:  [98.1 F (36.7 C)-98.6 F (37 C)] 98.6 F (37 C) (12/13 0520) Pulse Rate:  [57-74] 74 (12/13 0520) Resp:  [17-18] 17 (12/13 0520) BP: (112-122)/(65-66) 112/65 mmHg (12/13 0520)      Recent Labs  12/22/14 0239 12/22/14 0658  WBC 13.9* 16.4*  HGB 13.0 11.7*  HCT 38.3 34.7*  PLT 261 210    Blood type: A POS (12/11 0239) Rubella: Immune (05/12 0000)    Physical Exam:  General: alert, cooperative and no distress Uterine Fundus: firm, midline, U-3 Lochia: appropriate Perineum: 2nd degree repair healing well, edema none DVT Evaluation: No evidence of DVT seen on physical exam. Negative Homan's sign. No cords or calf tenderness. No significant calf/ankle edema.    Assessment/Plan: PPD # 2 / 34 y.o., [id number] S/P: spontaneous vaginal   Principal Problem:   Postpartum care following vaginal delivery (12/11)   normal postpartum exam  Continue current postpartum care  D/C home   LOS: 2 days   Raelyn MoraAWSON, Kristeen Lantz, M, MSN, CNM 12/24/2014, 8:26 AM

## 2014-12-24 NOTE — Lactation Note (Signed)
This note was copied from the chart of Kathryn Oconnor. Lactation Consultation Note  Patient Name: Kathryn Oconnor KGMWN'UToday's Date: 12/24/2014 Reason for consult: Follow-up assessment Mom reports baby is nursing well and that her milk is coming in. Engorgement care reviewed if needed. Offered to observe latch, Mom declined. Advised of OP services and support group. Encouraged to call for questions/concerns.   Maternal Data    Feeding Feeding Type: Breast Fed Length of feed: 20 min  LATCH Score/Interventions                      Lactation Tools Discussed/Used Tools: Pump Breast pump type: Double-Electric Breast Pump   Consult Status Consult Status: Complete Date: 12/24/14 Follow-up type: In-patient    Kathryn Oconnor, Kathryn Oconnor 12/24/2014, 8:39 AM

## 2014-12-24 NOTE — Discharge Instructions (Signed)
Breast Pumping Tips °If you are breastfeeding, there may be times when you cannot feed your baby directly. Returning to work or going on a trip are common examples. Pumping allows you to store breast milk and feed it to your baby later.  °You may not get much milk when you first start to pump. Your breasts should start to make more after a few days. If you pump at the times you usually feed your baby, you may be able to keep making enough milk to feed your baby without also using formula. The more often you pump, the more milk you will produce.  °WHEN SHOULD I PUMP?  °· You can begin to pump soon after delivery. However, some experts recommend waiting about 4 weeks before giving your infant a bottle to make sure breastfeeding is going well.  °· If you plan to return to work, begin pumping a few weeks before. This will help you develop techniques that work best for you. It also lets you build up a supply of breast milk.   °· When you are with your infant, feed on demand and pump after each feeding.   °· When you are away from your infant for several hours, pump for about 15 minutes every 2-3 hours. Pump both breasts at the same time if you can.   °· If your infant has a formula feeding, make sure to pump around the same time.     °· If you drink any alcohol, wait 2 hours before pumping.   °HOW DO I PREPARE TO PUMP? °Your let-down reflex is the natural reaction to stimulation that makes your breast milk flow. It is easier to stimulate this reflex when you are relaxed. Find relaxation techniques that work for you. If you have difficulty with your let-down reflex, try these methods:  °· Smell one of your infant's blankets or an item of clothing.   °· Look at a picture or video of your infant.   °· Sit in a quiet, private space.   °· Massage the breast you plan to pump.   °· Place soothing warmth on the breast.   °· Play relaxing music.   °WHAT ARE SOME GENERAL BREAST PUMPING TIPS? °· Wash your hands before you pump. You  do not need to wash your nipples or breasts. °· There are three ways to pump. °¨ You can use your hand to massage and compress your breast. °¨ You can use a handheld manual pump. °¨ You can use an electric pump.   °· Make sure the suction cup (flange) on the breast pump is the right size. Place the flange directly over the nipple. If it is the wrong size or placed the wrong way, it may be painful and cause nipple damage.   °· If pumping is uncomfortable, apply a small amount of purified or modified lanolin to your nipple and areola. °· If you are using an electric pump, adjust the speed and suction power to be more comfortable. °· If pumping is painful or if you are not getting very much milk, you may need a different type of pump. A lactation consultant can help you determine what type of pump to use.   °· Keep a full water bottle near you at all times. Drinking lots of fluid helps you make more milk.  °· You can store your milk to use later. Pumped breast milk can be stored in a sealable, sterile container or plastic bag. Label all stored breast milk with the date you pumped it. °¨ Milk can stay out at room temperature for up to 8 hours. °¨   You can store your milk in the refrigerator for up to 8 days. °¨ You can store your milk in the freezer for 3 months. Thaw frozen milk using warm water. Do not put it in the microwave. °· Do not smoke. Smoking can lower your milk supply and harm your infant. If you need help quitting, ask your health care provider to recommend a program.   °WHEN SHOULD I CALL MY HEALTH CARE PROVIDER OR A LACTATION CONSULTANT? °· You are having trouble pumping. °· You are concerned that you are not making enough milk. °· You have nipple pain, soreness, or redness. °· You want to use birth control. Birth control pills may lower your milk supply. Talk to your health care provider about your options. °  °This information is not intended to replace advice given to you by your health care provider.  Make sure you discuss any questions you have with your health care provider. °  °Document Released: 06/17/2009 Document Revised: 01/02/2013 Document Reviewed: 10/20/2012 °[employer]vier Interactive Patient Education ©2016 [employer] °Postpartum Depression and Baby Blues °The postpartum period begins right after the birth of a baby. During this time, there is often a great amount of joy and excitement. It is also a time of many changes in the life of the parents. Regardless of how many times a mother gives birth, each child brings new challenges and dynamics to the family. It is not unusual to have feelings of excitement along with confusing shifts in moods, emotions, and thoughts. All mothers are at risk of developing postpartum depression or the "baby blues." These mood changes can occur right after giving birth, or they may occur many months after giving birth. The baby blues or postpartum depression can be mild or severe. Additionally, postpartum depression can go away rather quickly, or it can be a long-term condition.  °CAUSES °Raised hormone levels and the rapid drop in those levels are thought to be a main cause of postpartum depression and the baby blues. A number of hormones change during and after pregnancy. Estrogen and progesterone usually decrease right after the delivery of your baby. The levels of thyroid hormone and various cortisol steroids also rapidly drop. Other factors that play a role in these mood changes include major life events and genetics.  °RISK FACTORS °If you have any of the following risks for the baby blues or postpartum depression, know what symptoms to watch out for during the postpartum period. Risk factors that may increase the likelihood of getting the baby blues or postpartum depression include: °· Having a personal or family history of depression.   °· Having depression while being pregnant.   °· Having premenstrual mood issues or mood issues related to oral  contraceptives. °· Having a lot of life stress.   °· Having marital conflict.   °· Lacking a social support network.   °· Having a baby with special needs.   °· Having health problems, such as diabetes.   °SIGNS AND SYMPTOMS °Symptoms of baby blues include: °· Brief changes in mood, such as going from extreme happiness to sadness. °· Decreased concentration.   °· Difficulty sleeping.   °· Crying spells, tearfulness.   °· Irritability.   °· Anxiety.   °Symptoms of postpartum depression typically begin within the first month after giving birth. These symptoms include: °· Difficulty sleeping or excessive sleepiness.   °· Marked weight loss.   °· Agitation.   °· Feelings of worthlessness.   °· Lack of interest in activity or food.   °Postpartum psychosis is a very serious condition and can be dangerous. Fortunately, it is   rare. Displaying any of the following symptoms is cause for immediate medical attention. Symptoms of postpartum psychosis include:  °· Hallucinations and delusions.   °· Bizarre or disorganized behavior.   °· Confusion or disorientation.   °DIAGNOSIS  °A diagnosis is made by an evaluation of your symptoms. There are no medical or lab tests that lead to a diagnosis, but there are various questionnaires that a health care provider may use to identify those with the baby blues, postpartum depression, or psychosis. Often, a screening tool called the Edinburgh Postnatal Depression Scale is used to diagnose depression in the postpartum period.  °TREATMENT °The baby blues usually goes away on its own in 1-2 weeks. Social support is often all that is needed. You will be encouraged to get adequate sleep and rest. Occasionally, you may be given medicines to help you sleep.  °Postpartum depression requires treatment because it can last several months or longer if it is not treated. Treatment may include individual or group therapy, medicine, or both to address any social, physiological, and psychological factors  that may play a role in the depression. Regular exercise, a healthy diet, rest, and social support may also be strongly recommended.  °Postpartum psychosis is more serious and needs treatment right away. Hospitalization is often needed. °HOME CARE INSTRUCTIONS °· Get as much rest as you can. Nap when the baby sleeps.   °· Exercise regularly. Some women find yoga and walking to be beneficial.   °· Eat a balanced and nourishing diet.   °· Do little things that you enjoy. Have a cup of tea, take a bubble bath, read your favorite magazine, or listen to your favorite music. °· Avoid alcohol.   °· Ask for help with household chores, cooking, grocery shopping, or running errands as needed. Do not try to do everything.   °· Talk to people close to you about how you are feeling. Get support from your partner, family members, friends, or other new moms. °· Try to stay positive in how you think. Think about the things you are grateful for.   °· Do not spend a lot of time alone.   °· Only take over-the-counter or prescription medicine as directed by your health care provider. °· Keep all your postpartum appointments.   °· Let your health care provider know if you have any concerns.   °SEEK MEDICAL CARE IF: °You are having a reaction to or problems with your medicine. °SEEK IMMEDIATE MEDICAL CARE IF: °· You have suicidal feelings.   °· You think you may harm the baby or someone else. °MAKE SURE YOU: °· Understand these instructions. °· Will watch your condition. °· Will get help right away if you are not doing well or get worse. °  °This information is not intended to replace advice given to you by your health care provider. Make sure you discuss any questions you have with your health care provider. °  °Document Released: 10/02/2003 Document Revised: 01/02/2013 Document Reviewed: 10/09/2012 °Elsevier Interactive Patient Education ©2016 [employer] °Postpartum Care After Vaginal Delivery °After you deliver your newborn  (postpartum period), the usual stay in the hospital is 24-72 hours. If there were problems with your labor or delivery, or if you have other medical problems, you might be in the hospital longer.  °While you are in the hospital, you will receive help and instructions on how to care for yourself and your newborn during the postpartum period.  °While you are in the hospital: °· Be sure to tell your nurses if you have pain or discomfort, as well as   where you feel the pain and what makes the pain worse. °· If you had an incision made near your vagina (episiotomy) or if you had some tearing during delivery, the nurses may put ice packs on your episiotomy or tear. The ice packs may help to reduce the pain and swelling. °· If you are breastfeeding, you may feel uncomfortable contractions of your uterus for a couple of weeks. This is normal. The contractions help your uterus get back to normal size. °· It is normal to have some bleeding after delivery. °¨ For the first 1-3 days after delivery, the flow is red and the amount may be similar to a period. °¨ It is common for the flow to start and stop. °¨ In the first few days, you may pass some small clots. Let your nurses know if you begin to pass large clots or your flow increases. °¨ Do not  flush blood clots down the toilet before having the nurse look at them. °¨ During the next 3-10 days after delivery, your flow should become more watery and pink or brown-tinged in color. °¨ Ten to fourteen days after delivery, your flow should be a small amount of yellowish-white discharge. °¨ The amount of your flow will decrease over the first few weeks after delivery. Your flow may stop in 6-8 weeks. Most women have had their flow stop by 12 weeks after delivery. °· You should change your sanitary pads frequently. °· Wash your hands thoroughly with soap and water for at least 20 seconds after changing pads, using the toilet, or before holding or feeding your newborn. °· You should  feel like you need to empty your bladder within the first 6-8 hours after delivery. °· In case you become weak, lightheaded, or faint, call your nurse before you get out of bed for the first time and before you take a shower for the first time. °· Within the first few days after delivery, your breasts may begin to feel tender and full. This is called engorgement. Breast tenderness usually goes away within 48-72 hours after engorgement occurs. You may also notice milk leaking from your breasts. If you are not breastfeeding, do not stimulate your breasts. Breast stimulation can make your breasts produce more milk. °· Spending as much time as possible with your newborn is very important. During this time, you and your newborn can feel close and get to know each other. Having your newborn stay in your room (rooming in) will help to strengthen the bond with your newborn.  It will give you time to get to know your newborn and become comfortable caring for your newborn. °· Your hormones change after delivery. Sometimes the hormone changes can temporarily cause you to feel sad or tearful. These feelings should not last more than a few days. If these feelings last longer than that, you should talk to your caregiver. °· If desired, talk to your caregiver about methods of family planning or contraception. °· Talk to your caregiver about immunizations. Your caregiver may want you to have the following immunizations before leaving the hospital: °¨ Tetanus, diphtheria, and pertussis (Tdap) or tetanus and diphtheria (Td) immunization. It is very important that you and your family (including grandparents) or others caring for your newborn are up-to-date with the Tdap or Td immunizations. The Tdap or Td immunization can help protect your newborn from getting ill. °¨ Rubella immunization. °¨ Varicella (chickenpox) immunization. °¨ Influenza immunization. You should receive this annual immunization if you did not receive the    immunization during your pregnancy. °  °This information is not intended to replace advice given to you by your health care provider. Make sure you discuss any questions you have with your health care provider. °  °Document Released: 10/25/2006 Document Revised: 09/22/2011 Document Reviewed: 08/25/2011 °[employer]vier Interactive Patient Education ©2016 [employer] °Breastfeeding and Mastitis °Mastitis is inflammation of the breast tissue. It can occur in women who are breastfeeding. This can make breastfeeding painful. Mastitis will sometimes go away on its own. Your health care provider will help determine if treatment is needed. °CAUSES °Mastitis is often associated with a blocked milk (lactiferous) duct. This can happen when too much milk builds up in the breast. Causes of excess milk in the breast can include: °· Poor latch-on. If your baby is not latched onto the breast properly, she or he may not empty your breast completely while breastfeeding. °· Allowing too much time to pass between feedings. °· Wearing a bra or other clothing that is too tight. This puts extra pressure on the lactiferous ducts so milk does not flow through them as it should. °Mastitis can also be caused by a bacterial infection. Bacteria may enter the breast tissue through cuts or openings in the skin. In women who are breastfeeding, this may occur because of cracked or irritated skin. Cracks in the skin are often caused when your baby does not latch on properly to the breast. °SIGNS AND SYMPTOMS °· Swelling, redness, tenderness, and pain in an area of the breast. °· Swelling of the glands under the arm on the same side. °· Fever may or may not accompany mastitis. °If an infection is allowed to progress, a collection of pus (abscess) may develop. °DIAGNOSIS  °Your health care provider can usually diagnose mastitis based on your symptoms and a physical exam. Tests may be done to help confirm the diagnosis. These may include: °· Removal of pus  from the breast by applying pressure to the area. This pus can be examined in the lab to determine which bacteria are present. If an abscess has developed, the fluid in the abscess can be removed with a needle. This can also be used to confirm the diagnosis and determine the bacteria present. In most cases, pus will not be present. °· Blood tests to determine if your body is fighting a bacterial infection. °· Mammogram or ultrasound tests to rule out other problems or diseases. °TREATMENT  °Mastitis that occurs with breastfeeding will sometimes go away on its own. Your health care provider may choose to wait 24 hours after first seeing you to decide whether a prescription medicine is needed. If your symptoms are worse after 24 hours, your health care provider will likely prescribe an antibiotic medicine to treat the mastitis. He or she will determine which bacteria are most likely causing the infection and will then select an appropriate antibiotic medicine. This is sometimes changed based on the results of tests performed to identify the bacteria, or if there is no response to the antibiotic medicine selected. Antibiotic medicines are usually given by mouth. You may also be given medicine for pain. °HOME CARE INSTRUCTIONS °· Only take over-the-counter or prescription medicines for pain, fever, or discomfort as directed by your health care provider. °· If your health care provider prescribed an antibiotic medicine, take the medicine as directed. Make sure you finish it even if you start to feel better. °· Do not wear a tight or underwire bra. Wear a soft, supportive bra. °· Increase your fluid   intake, especially if you have a fever. °· Continue to empty the breast. Your health care provider can tell you whether this milk is safe for your infant or needs to be thrown out. You may be told to stop nursing until your health care provider thinks it is safe for your baby. Use a breast pump if you are advised to stop  nursing. °· Keep your nipples clean and dry. °· Empty the first breast completely before going to the other breast. If your baby is not emptying your breasts completely for some reason, use a breast pump to empty your breasts. °· If you go back to work, pump your breasts while at work to stay in time with your nursing schedule. °· Avoid allowing your breasts to become overly filled with milk (engorged). °SEEK MEDICAL CARE IF: °· You have pus-like discharge from the breast. °· Your symptoms do not improve with the treatment prescribed by your health care provider within 2 days. °SEEK IMMEDIATE MEDICAL CARE IF: °· Your pain and swelling are getting worse. °· You have pain that is not controlled with medicine. °· You have a red line extending from the breast toward your armpit. °· You have a fever or persistent symptoms for more than 2-3 days. °· You have a fever and your symptoms suddenly get worse. °MAKE SURE YOU:  °· Understand these instructions. °· Will watch your condition. °· Will get help right away if you are not doing well or get worse. °  °This information is not intended to replace advice given to you by your health care provider. Make sure you discuss any questions you have with your health care provider. °  °Document Released: 04/24/2004 Document Revised: 01/02/2013 Document Reviewed: 08/03/2012 °[employer]vier Interactive Patient Education ©2016 [employer] ° °Breastfeeding °Deciding to breastfeed is one of the best choices you can make for you and your baby. A change in hormones during pregnancy causes your breast tissue to grow and increases the number and size of your milk ducts. These hormones also allow proteins, sugars, and fats from your blood supply to make breast milk in your milk-producing glands. Hormones prevent breast milk from being released before your baby is born as well as prompt milk flow after birth. Once breastfeeding has begun, thoughts of your baby, as well as his or her sucking or  crying, can stimulate the release of milk from your milk-producing glands.  °BENEFITS OF BREASTFEEDING °For Your Baby °· Your first milk (colostrum) helps your baby's digestive system function better. °· There are antibodies in your milk that help your baby fight off infections. °· Your baby has a lower incidence of asthma, allergies, and sudden infant death syndrome. °· The nutrients in breast milk are better for your baby than infant formulas and are designed uniquely for your baby's needs. °· Breast milk improves your baby's brain development. °· Your baby is less likely to develop other conditions, such as childhood obesity, asthma, or type 2 diabetes mellitus. °For You °· Breastfeeding helps to create a very special bond between you and your baby. °· Breastfeeding is convenient. Breast milk is always available at the correct temperature and costs nothing. °· Breastfeeding helps to burn calories and helps you lose the weight gained during pregnancy. °· Breastfeeding makes your uterus contract to its prepregnancy size faster and slows bleeding (lochia) after you give birth.   °· Breastfeeding helps to lower your risk of developing type 2 diabetes mellitus, osteoporosis, and breast or ovarian cancer later in life. °  SIGNS THAT YOUR BABY IS HUNGRY °Early Signs of Hunger °· Increased alertness or activity. °· Stretching. °· Movement of the head from side to side. °· Movement of the head and opening of the mouth when the corner of the mouth or cheek is stroked (rooting). °· Increased sucking sounds, smacking lips, cooing, sighing, or squeaking. °· Hand-to-mouth movements. °· Increased sucking of fingers or hands. °Late Signs of Hunger °· Fussing. °· Intermittent crying. °Extreme Signs of Hunger °Signs of extreme hunger will require calming and consoling before your baby will be able to breastfeed successfully. Do not wait for the following signs of extreme hunger to occur before you initiate  breastfeeding: °· Restlessness. °· A loud, strong cry. °· Screaming. °BREASTFEEDING BASICS °Breastfeeding Initiation °· Find a comfortable place to sit or lie down, with your neck and back well supported. °· Place a pillow or rolled up blanket under your baby to bring him or her to the level of your breast (if you are seated). Nursing pillows are specially designed to help support your arms and your baby while you breastfeed. °· Make sure that your baby's abdomen is facing your abdomen. °· Gently massage your breast. With your fingertips, massage from your chest wall toward your nipple in a circular motion. This encourages milk flow. You may need to continue this action during the feeding if your milk flows slowly. °· Support your breast with 4 fingers underneath and your thumb above your nipple. Make sure your fingers are well away from your nipple and your baby's mouth. °· Stroke your baby's lips gently with your finger or nipple. °· When your baby's mouth is open wide enough, quickly bring your baby to your breast, placing your entire nipple and as much of the colored area around your nipple (areola) as possible into your baby's mouth. °¨ More areola should be visible above your baby's upper lip than below the lower lip. °¨ Your baby's tongue should be between his or her lower gum and your breast. °· Ensure that your baby's mouth is correctly positioned around your nipple (latched). Your baby's lips should create a seal on your breast and be turned out (everted). °· It is common for your baby to suck about 2-3 minutes in order to start the flow of breast milk. °Latching °Teaching your baby how to latch on to your breast properly is very important. An improper latch can cause nipple pain and decreased milk supply for you and poor weight gain in your baby. Also, if your baby is not latched onto your nipple properly, he or she may swallow some air during feeding. This can make your baby fussy. Burping your baby when  you switch breasts during the feeding can help to get rid of the air. However, teaching your baby to latch on properly is still the best way to prevent fussiness from swallowing air while breastfeeding. °Signs that your baby has successfully latched on to your nipple: °· Silent tugging or silent sucking, without causing you pain. °· Swallowing heard between every 3-4 sucks. °· Muscle movement above and in front of his or her ears while sucking. °Signs that your baby has not successfully latched on to nipple: °· Sucking sounds or smacking sounds from your baby while breastfeeding. °· Nipple pain. °If you think your baby has not latched on correctly, slip your finger into the corner of your baby's mouth to break the suction and place it between your baby's gums. Attempt breastfeeding initiation again. °Signs of Successful Breastfeeding °  Signs from your baby: °· A gradual decrease in the number of sucks or complete cessation of sucking. °· Falling asleep. °· Relaxation of his or her body. °· Retention of a small amount of milk in his or her mouth. °· Letting go of your breast by himself or herself. °Signs from you: °· Breasts that have increased in firmness, weight, and size 1-3 hours after feeding. °· Breasts that are softer immediately after breastfeeding. °· Increased milk volume, as well as a change in milk consistency and color by the fifth day of breastfeeding. °· Nipples that are not sore, cracked, or bleeding. °Signs That Your Baby is Getting Enough Milk °· Wetting at least 3 diapers in a 24-hour period. The urine should be clear and pale yellow by age 5 days. °· At least 3 stools in a 24-hour period by age 5 days. The stool should be soft and yellow. °· At least 3 stools in a 24-hour period by age 7 days. The stool should be seedy and yellow. °· No loss of weight greater than 10% of birth weight during the first 3 days of age. °· Average weight gain of 4-7 ounces (113-198 g) per week after age 4  days. °· Consistent daily weight gain by age 5 days, without weight loss after the age of 2 weeks. °After a feeding, your baby may spit up a small amount. This is common. °BREASTFEEDING FREQUENCY AND DURATION °Frequent feeding will help you make more milk and can prevent sore nipples and breast engorgement. Breastfeed when you feel the need to reduce the fullness of your breasts or when your baby shows signs of hunger. This is called "breastfeeding on demand." Avoid introducing a pacifier to your baby while you are working to establish breastfeeding (the first 4-6 weeks after your baby is born). After this time you may choose to use a pacifier. Research has shown that pacifier use during the first year of a baby's life decreases the risk of sudden infant death syndrome (SIDS). °Allow your baby to feed on each breast as long as he or she wants. Breastfeed until your baby is finished feeding. When your baby unlatches or falls asleep while feeding from the first breast, offer the second breast. Because newborns are often sleepy in the first few weeks of life, you may need to awaken your baby to get him or her to feed. °Breastfeeding times will vary from baby to baby. However, the following rules can serve as a guide to help you ensure that your baby is properly fed: °· Newborns (babies 4 weeks of age or younger) may breastfeed every 1-3 hours. °· Newborns should not go longer than 3 hours during the day or 5 hours during the night without breastfeeding. °· You should breastfeed your baby a minimum of 8 times in a 24-hour period until you begin to introduce solid foods to your baby at around 6 months of age. °BREAST MILK PUMPING °Pumping and storing breast milk allows you to ensure that your baby is exclusively fed your breast milk, even at times when you are unable to breastfeed. This is especially important if you are going back to work while you are still breastfeeding or when you are not able to be present during  feedings. Your lactation consultant can give you guidelines on how long it is safe to store breast milk. °A breast pump is a machine that allows you to pump milk from your breast into a sterile bottle. The pumped breast milk can   then be stored in a refrigerator or freezer. Some breast pumps are operated by hand, while others use electricity. Ask your lactation consultant which type will work best for you. Breast pumps can be purchased, but some hospitals and breastfeeding support groups lease breast pumps on a monthly basis. A lactation consultant can teach you how to hand express breast milk, if you prefer not to use a pump. °CARING FOR YOUR BREASTS WHILE YOU BREASTFEED °Nipples can become dry, cracked, and sore while breastfeeding. The following recommendations can help keep your breasts moisturized and healthy: °· Avoid using soap on your nipples. °· Wear a supportive bra. Although not required, special nursing bras and tank tops are designed to allow access to your breasts for breastfeeding without taking off your entire bra or top. Avoid wearing underwire-style bras or extremely tight bras. °· Air dry your nipples for 3-4 minutes after each feeding. °· Use only cotton bra pads to absorb leaked breast milk. Leaking of breast milk between feedings is normal. °· Use lanolin on your nipples after breastfeeding. Lanolin helps to maintain your skin's normal moisture barrier. If you use pure lanolin, you do not need to wash it off before feeding your baby again. Pure lanolin is not toxic to your baby. You may also hand express a few drops of breast milk and gently massage that milk into your nipples and allow the milk to air dry. °In the first few weeks after giving birth, some women experience extremely full breasts (engorgement). Engorgement can make your breasts feel heavy, warm, and tender to the touch. Engorgement peaks within 3-5 days after you give birth. The following recommendations can help ease  engorgement: °· Completely empty your breasts while breastfeeding or pumping. You may want to start by applying warm, moist heat (in the shower or with warm water-soaked hand towels) just before feeding or pumping. This increases circulation and helps the milk flow. If your baby does not completely empty your breasts while breastfeeding, pump any extra milk after he or she is finished. °· Wear a snug bra (nursing or regular) or tank top for 1-2 days to signal your body to slightly decrease milk production. °· Apply ice packs to your breasts, unless this is too uncomfortable for you. °· Make sure that your baby is latched on and positioned properly while breastfeeding. °If engorgement persists after 48 hours of following these recommendations, contact your health care provider or a lactation consultant. °OVERALL HEALTH CARE RECOMMENDATIONS WHILE BREASTFEEDING °· Eat healthy foods. Alternate between meals and snacks, eating 3 of each per day. Because what you eat affects your breast milk, some of the foods may make your baby more irritable than usual. Avoid eating these foods if you are sure that they are negatively affecting your baby. °· Drink milk, fruit juice, and water to satisfy your thirst (about 10 glasses a day). °· Rest often, relax, and continue to take your prenatal vitamins to prevent fatigue, stress, and anemia. °· Continue breast self-awareness checks. °· Avoid chewing and smoking tobacco. Chemicals from cigarettes that pass into breast milk and exposure to secondhand smoke may harm your baby. °· Avoid alcohol and drug use, including marijuana. °Some medicines that may be harmful to your baby can pass through breast milk. It is important to ask your health care provider before taking any medicine, including all over-the-counter and prescription medicine as well as vitamin and herbal supplements. °It is possible to become pregnant while breastfeeding. If birth control is desired, ask your health care    provider about options that will be safe for your baby. °SEEK MEDICAL CARE IF: °· You feel like you want to stop breastfeeding or have become frustrated with breastfeeding. °· You have painful breasts or nipples. °· Your nipples are cracked or bleeding. °· Your breasts are red, tender, or warm. °· You have a swollen area on either breast. °· You have a fever or chills. °· You have nausea or vomiting. °· You have drainage other than breast milk from your nipples. °· Your breasts do not become full before feedings by the fifth day after you give birth. °· You feel sad and depressed. °· Your baby is too sleepy to eat well. °· Your baby is having trouble sleeping.   °· Your baby is wetting less than 3 diapers in a 24-hour period. °· Your baby has less than 3 stools in a 24-hour period. °· Your baby's skin or the white part of his or her eyes becomes yellow.   °· Your baby is not gaining weight by 5 days of age. °SEEK IMMEDIATE MEDICAL CARE IF: °· Your baby is overly tired (lethargic) and does not want to wake up and feed. °· Your baby develops an unexplained fever. °  °This information is not intended to replace advice given to you by your health care provider. Make sure you discuss any questions you have with your health care provider. °  °Document Released: 12/28/2004 Document Revised: 09/18/2014 Document Reviewed: 06/21/2012 °Elsevier Interactive Patient Education ©2016 [employer] ° °

## 2014-12-24 NOTE — Discharge Summary (Signed)
OB Discharge Summary     Patient Name: Kathryn KaufmannCrystal Oconnor DOB: [birth year] MRN: [id number]  Date of admission: 12/22/2014 Delivering MD: Olivia MackieAAVON, RICHARD   Date of discharge: 12/24/2014  Admitting diagnosis: 39wks, labor Intrauterine pregnancy: 1779w2d     Secondary diagnosis:  Principal Problem:   Postpartum care following vaginal delivery (12/11)  Additional problems: none     Discharge diagnosis: Term Pregnancy Delivered                                                                                                Post partum procedures:none  Augmentation: none  Complications: None  Hospital course:  Onset of Labor With Vaginal Delivery     34 y.o. yo G2P2002 at 2879w2d was admitted in [facility] 12/22/2014. Patient had an uncomplicated labor course as follows:  Membrane Rupture Time/Date: 3:52 AM ,12/22/2014   Intrapartum Procedures: Episiotomy: None [1]                                         Lacerations:  1st degree [2]  Patient had a delivery of a Viable infant. 12/22/2014  Information for the patient's newborn:  Maebelle MunroeMatthews, Girl Kathryn Oconnor [[id number]][[id number]]  Delivery Method: Vaginal, Spontaneous Delivery (Filed from Delivery Summary)    Pateint had an uncomplicated postpartum course.  She is ambulating, tolerating a regular diet, passing flatus, and urinating well. Patient is discharged home in stable condition on No discharge date for patient encounter.Marland Kitchen.    Physical exam  Filed Vitals:   12/22/14 1839 12/23/14 0700 12/23/14 1733 12/24/14 0520  BP: 116/64 115/77 122/66 112/65  Pulse: 70 61 57 74  Temp: 98.3 F (36.8 C) 98.4 F (36.9 C) 98.1 F (36.7 C) 98.6 F (37 C)  TempSrc: Oral  Oral Oral  Resp: 18 18 18 17   Height:      Weight:      SpO2:       General: alert, cooperative and no distress Lochia: appropriate Uterine Fundus: firm Perineum: Healing well with no significant drainage, No significant erythema DVT Evaluation: No evidence of DVT seen on physical  exam. Negative Homan's sign. No cords or calf tenderness. No significant calf/ankle edema. Labs: Lab Results  Component Value Date   WBC 16.4* 12/22/2014   HGB 11.7* 12/22/2014   HCT 34.7* 12/22/2014   MCV 93.5 12/22/2014   PLT 210 12/22/2014   No flowsheet data found.  Discharge instruction: per After Visit Summary and "Baby and Me Booklet".  After visit meds:    Medication List    TAKE these medications        ibuprofen 600 MG tablet  Commonly known as:  ADVIL,MOTRIN  Take 1 tablet (600 mg total) by mouth every 6 (six) hours.     PRENATAL VITAMINS PO  Take 1 each by mouth daily.        Diet: routine diet  Activity: Advance as tolerated. Pelvic rest for 6 weeks.   Outpatient follow up:6 weeks Follow up Appt:No future appointments. Follow up  Visit:No Follow-up on file.  Postpartum contraception: Undecided  Newborn Data: Live born female on 12/22/2014 Birth Weight: 6 lb 0.7 oz (2741 g) APGAR: 8, 9  Baby Feeding: Breast Disposition:home with mother   12/24/2014 Raelyn Mora, Judie Petit, CNM

## 2014-12-25 ENCOUNTER — Inpatient Hospital Stay (HOSPITAL_COMMUNITY): Admission: RE | Admit: 2014-12-25 | Payer: BLUE CROSS/BLUE SHIELD | Source: Ambulatory Visit

## 2015-03-12 ENCOUNTER — Encounter: Payer: Self-pay | Admitting: Family Medicine

## 2015-03-12 ENCOUNTER — Ambulatory Visit (INDEPENDENT_AMBULATORY_CARE_PROVIDER_SITE_OTHER): Payer: BLUE CROSS/BLUE SHIELD | Admitting: Nurse Practitioner

## 2015-03-12 ENCOUNTER — Encounter: Payer: Self-pay | Admitting: Nurse Practitioner

## 2015-03-12 VITALS — BP 110/80 | Temp 98.9°F | Ht 63.0 in | Wt 122.1 lb

## 2015-03-12 DIAGNOSIS — R55 Syncope and collapse: Secondary | ICD-10-CM | POA: Diagnosis not present

## 2015-03-12 DIAGNOSIS — R42 Dizziness and giddiness: Secondary | ICD-10-CM

## 2015-03-12 DIAGNOSIS — J300 Vasomotor rhinitis: Secondary | ICD-10-CM

## 2015-03-12 LAB — POCT GLUCOSE (DEVICE FOR HOME USE): POC Glucose: 136 mg/dl — AB (ref 70–99)

## 2015-03-12 LAB — POCT GLYCOSYLATED HEMOGLOBIN (HGB A1C): Hemoglobin A1C: 4.9

## 2015-03-12 LAB — POCT HEMOGLOBIN: Hemoglobin: 14.2 g/dL (ref 12.2–16.2)

## 2015-03-12 NOTE — Patient Instructions (Signed)
Nasacort AQ as directed Loratadine 10 mg once daily

## 2015-03-12 NOTE — Progress Notes (Signed)
Subjective:   Presents with her husband for complaints of dizziness that began about a week ago. Also complaints of foggy thinking , slight third vision at times , no true headache. Mainly some pressure at times. Difficulty thinking clearly. Describes it is "hard to form a thought". Has a 4-month-old infant. Breast-feeding. Had gestational diabetes, her blood sugars since then have been normal according to patient. No nausea vomiting. No fever sore throat ear pain or cough. Her worst episode was today, became very dizzy when taking her child to daycare. Was nervous about driving home due to extreme dizziness. Symptoms usually will improve after eating or drinking. Occur off and on. Dizziness occurs no matter what position she is in. Has a history of a heart murmur. No palpitations chest pain or ischemic type pain. No syncopal episodes , felt faint earlier. Has had light breakthrough bleeding for the past 3 weeks since getting Mirena , this is her second one. No difficulty with her first IUD.  Objective:   BP 110/80 mmHg  Temp(Src) 98.9 F (37.2 C) (Oral)  Ht  (1.6 m)  Wt 122 lb 2 oz (55.396 kg)  BMI 21.64 kg/m2  NAD. Alert, oriented. Thoughts logical coherent and relevant. Right TM significant clear effusion. Left TM retracted, no erythema. Pupils equal and reactive. EOMs intact without nystagmus.  Thyroid normal limit to palpation And nontender.Pharynx minimally injected with clear PND noted. Neck supple with mild soft anterior adenopathy. Lungs clear. Heart regular rate rhythm. Very faint grade 2 early systolic murmur noted. Loudest at the  PMI. Point-to-point localization normal limit. Hand strength 5+ bilateral. Face symmetrical. Speech normal. Reflexes normal limit lower extremities. Romberg negative. Results for orders placed or performed in visit on 03/12/15  POCT Glucose (Device for Home Use)  Result Value Ref Range   Glucose Fasting, POC  70 - 99 mg/dL   POC Glucose 962 (A) 70 - 99 mg/dl   POCT HgB X5M  Result Value Ref Range   Hemoglobin A1C 4.9   POCT hemoglobin  Result Value Ref Range   Hemoglobin 14.2 12.2 - 16.2 g/dL     Assessment: Dizziness - Plan: POCT Glucose (Device for Home Use), POCT HgB A1C, POCT hemoglobin, TSH  Near syncope - Plan: TSH  Vasomotor rhinitis   Plan:   unlikely related to Mirena since this is her second 1 she has had. No significant neurologic symptoms noted on exam at this time. Recommend regular meals plus snacks to avoid any possible hypoglycemia. Has appointment with gynecology tomorrow to discuss bleeding. Start OTC meds as directed for head congestion. Warning signs reviewed. Call back in 48 hours if no improvement in symptoms, call or go to ED sooner if worse.

## 2015-03-13 LAB — TSH: TSH: 1.63 u[IU]/mL (ref 0.450–4.500)

## 2015-04-17 ENCOUNTER — Telehealth: Payer: Self-pay | Admitting: Family Medicine

## 2015-04-17 NOTE — Telephone Encounter (Signed)
Daily claritin, may add occasional sudafed tablets

## 2015-04-17 NOTE — Telephone Encounter (Signed)
Notified patient daily claritin, may add occasional sudafed tablets. Patient verbalized understanding.

## 2015-04-17 NOTE — Telephone Encounter (Signed)
Pt is breastfeeding and is having a runny nose,ears stopped up and congestion. Pt wants to try something over the counter first unless it is absolutely needed that she be seen. Please advise.

## 2015-09-16 ENCOUNTER — Ambulatory Visit (INDEPENDENT_AMBULATORY_CARE_PROVIDER_SITE_OTHER): Payer: BLUE CROSS/BLUE SHIELD | Admitting: Family Medicine

## 2015-09-16 ENCOUNTER — Encounter: Payer: Self-pay | Admitting: Family Medicine

## 2015-09-16 VITALS — BP 118/72 | Temp 100.7°F | Ht 63.0 in | Wt 119.0 lb

## 2015-09-16 DIAGNOSIS — J019 Acute sinusitis, unspecified: Secondary | ICD-10-CM

## 2015-09-16 DIAGNOSIS — B9689 Other specified bacterial agents as the cause of diseases classified elsewhere: Secondary | ICD-10-CM

## 2015-09-16 MED ORDER — CEFDINIR 300 MG PO CAPS: 300.0000 mg | ORAL_CAPSULE | Freq: Two times a day (BID) | ORAL | 0 refills | Status: DC

## 2015-09-16 NOTE — Progress Notes (Signed)
   Subjective:    Patient ID: Kathryn Kaufmannrystal Weedman, female    DOB: [birth year], 35 y.o.   MRN: [id number]   Pt is breast feeding.   Sinusitis  This is a new problem. The current episode started yesterday. Associated symptoms include congestion, coughing, headaches and a sore throat. (Fever)    Aching all over, highest temp a couple hrs ago , 101.5 maybe  Sore throat congested  temle  h a bilat  Min cough     Review of Systems  HENT: Positive for congestion and sore throat.   Respiratory: Positive for cough.   Neurological: Positive for headaches.       Objective:   Physical Exam  Alert, mild malaise. Hydration good Vitals stable. frontal/ maxillary tenderness evident positive nasal congestion. pharynx normal neck supple  lungs clear/no crackles or wheezes. heart regular in rhythm       Assessment & Plan:  Impression rhinosinusitis likely post viral, discussed with patient. plan antibiotics prescribed. Questions answered. Symptomatic care discussed. warning signs discussed. WSL

## 2016-02-23 IMAGING — DX DG CHEST 2V
2 series · 2 of 2 positions shown · non-contrast
Comparison: None.

CLINICAL DATA: Chest pain, smoker

EXAM:
CHEST  2 VIEW

[chest pa]
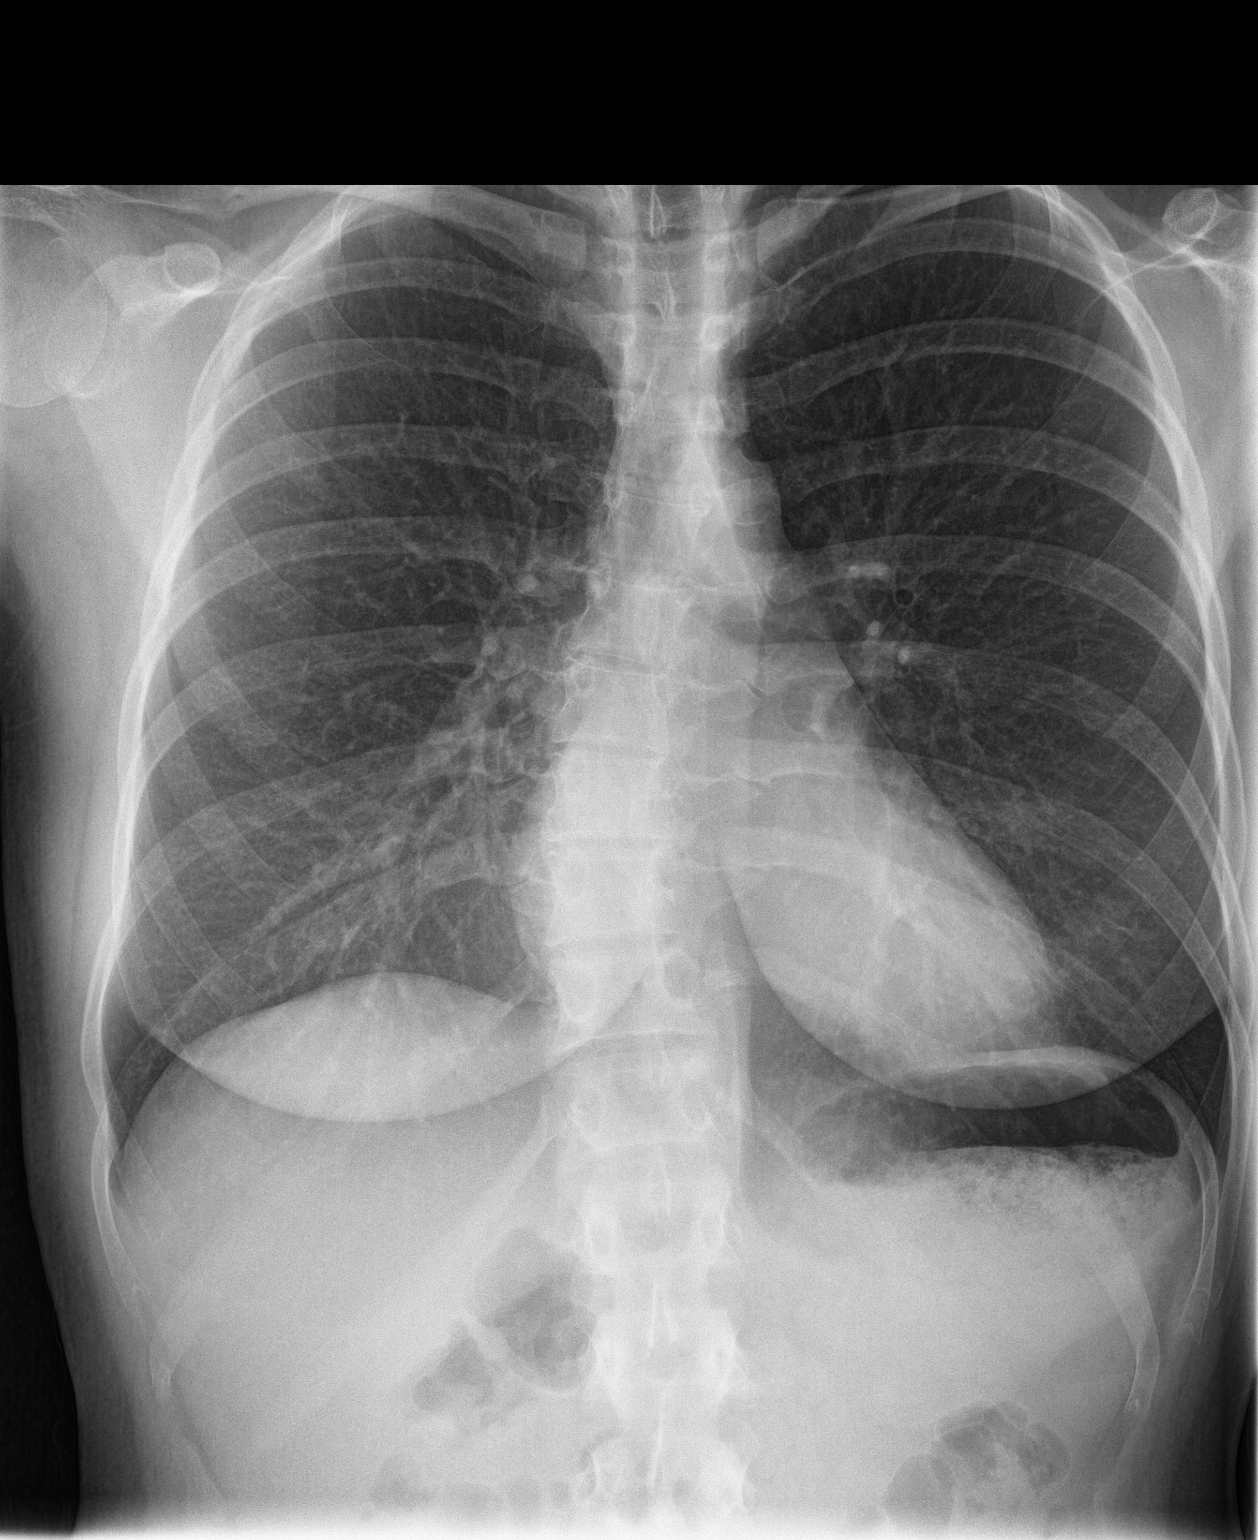

[chest lat]
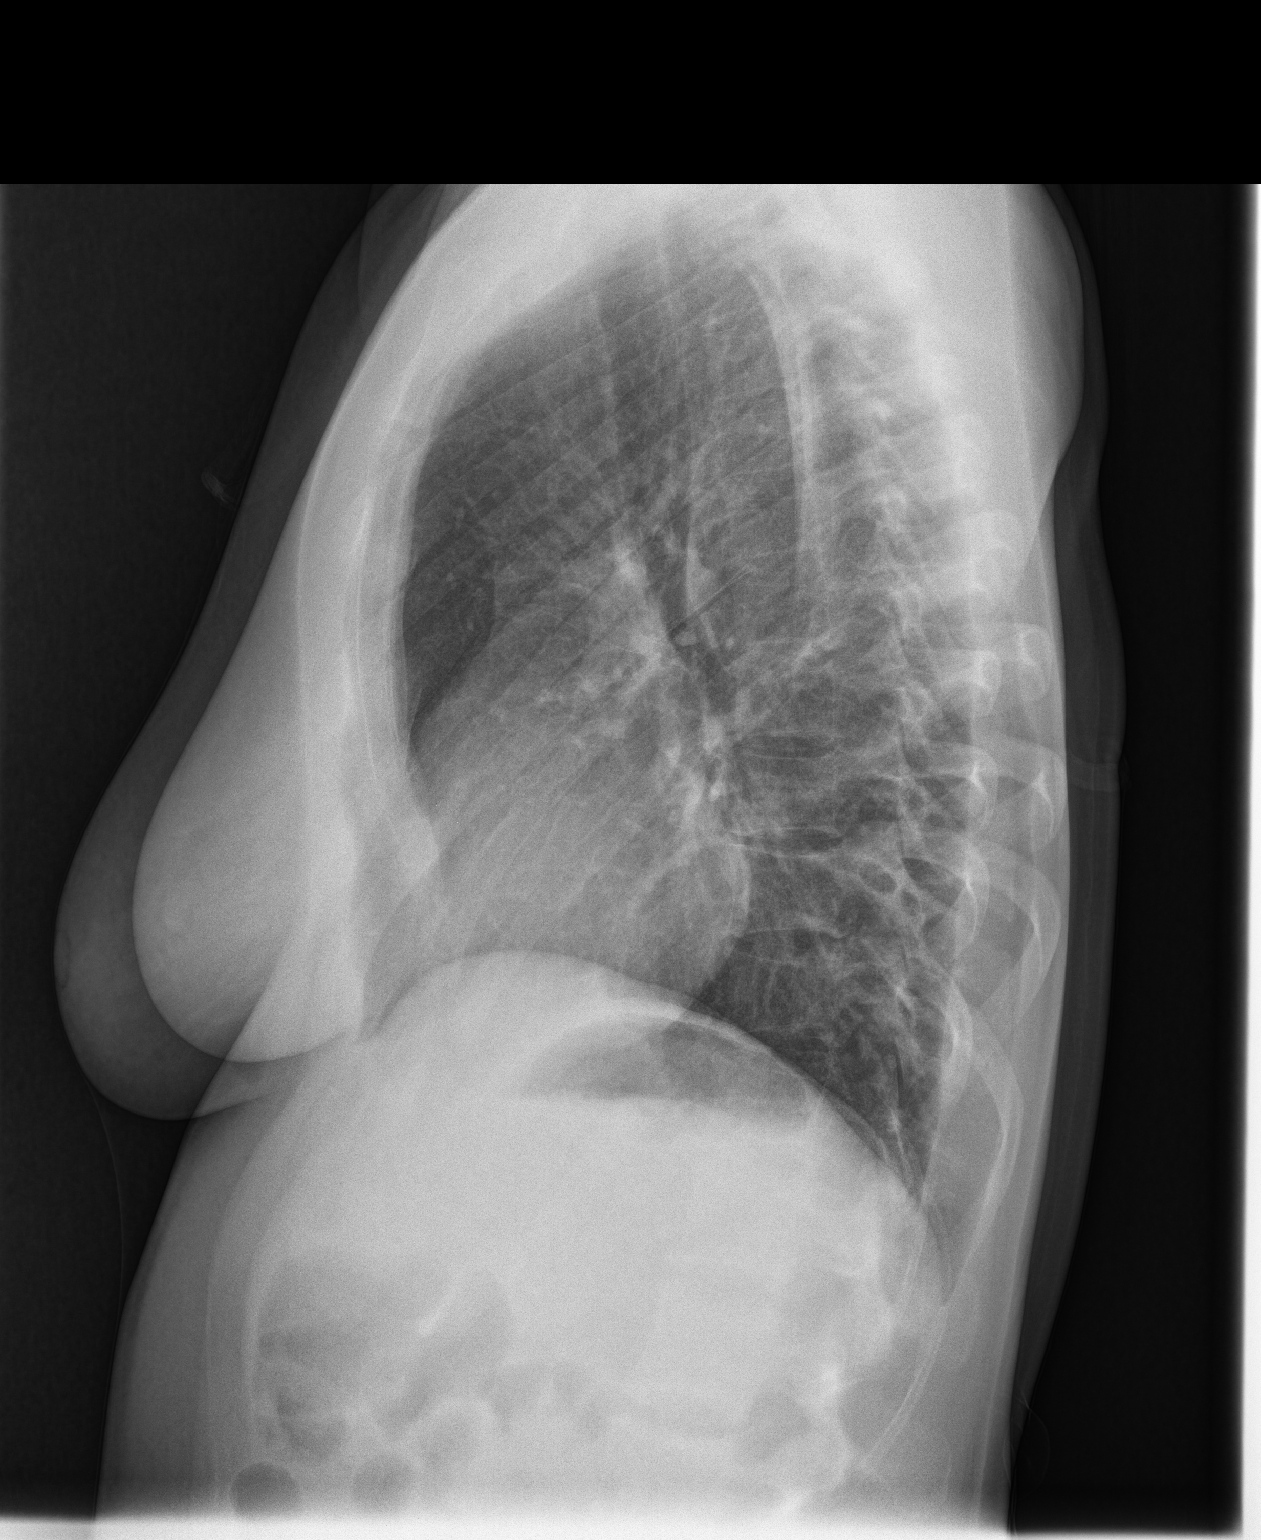

[2 of 2 positions shown; findings below may reference images not displayed]

FINDINGS: Cardiomediastinal silhouette is unremarkable. Lower thoracic
dextroscoliosis. No acute infiltrate or pleural effusion. No
pulmonary edema. Mild hyperinflation.
IMPRESSION: No active disease. Mild hyperinflation. Lower thoracic Mild
dextroscoliosis.

## 2019-02-12 ENCOUNTER — Encounter: Payer: Self-pay | Admitting: Family Medicine

## 2019-09-01 ENCOUNTER — Emergency Department (HOSPITAL_COMMUNITY): Payer: 59

## 2019-09-01 ENCOUNTER — Other Ambulatory Visit: Payer: Self-pay

## 2019-09-01 ENCOUNTER — Emergency Department (HOSPITAL_COMMUNITY)
Admission: EM | Admit: 2019-09-01 | Discharge: 2019-09-01 | Disposition: A | Discharge status: Home / Self Care | Payer: 59 | Attending: Emergency Medicine | Admitting: Emergency Medicine

## 2019-09-01 ENCOUNTER — Encounter (HOSPITAL_COMMUNITY): Payer: Self-pay | Admitting: Emergency Medicine

## 2019-09-01 DIAGNOSIS — F17210 Nicotine dependence, cigarettes, uncomplicated: Secondary | ICD-10-CM | POA: Insufficient documentation

## 2019-09-01 DIAGNOSIS — R0789 Other chest pain: Secondary | ICD-10-CM | POA: Diagnosis present

## 2019-09-01 LAB — COMPREHENSIVE METABOLIC PANEL
ALT: 19 U/L (ref 0–44)
AST: 15 U/L (ref 15–41)
Albumin: 4 g/dL (ref 3.5–5.0)
Alkaline Phosphatase: 59 U/L (ref 38–126)
Anion gap: 8 (ref 5–15)
BUN: 13 mg/dL (ref 6–20)
CO2: 25 mmol/L (ref 22–32)
Calcium: 9.5 mg/dL (ref 8.9–10.3)
Chloride: 106 mmol/L (ref 98–111)
Creatinine, Ser: 0.61 mg/dL (ref 0.44–1.00)
GFR calc Af Amer: >60 mL/min (ref >60)
GFR calc non Af Amer: >60 mL/min (ref >60)
Glucose, Bld: 88 mg/dL (ref 70–99)
Potassium: 3.5 mmol/L (ref 3.5–5.1)
Sodium: 139 mmol/L (ref 135–145)
Total Bilirubin: 0.3 mg/dL (ref 0.3–1.2)
Total Protein: 6.7 g/dL (ref 6.5–8.1)

## 2019-09-01 LAB — CBC
HCT: 38.7 % (ref 36.0–46.0)
Hemoglobin: 12.6 g/dL (ref 12.0–15.0)
MCH: 32 pg (ref 26.0–34.0)
MCHC: 32.6 g/dL (ref 30.0–36.0)
MCV: 98.2 fL (ref 80.0–100.0)
Platelets: 196 10*3/uL (ref 150–400)
RBC: 3.94 MIL/uL (ref 3.87–5.11)
RDW: 12.8 % (ref 11.5–15.5)
WBC: 5.3 10*3/uL (ref 4.0–10.5)
nRBC: 0 % (ref 0.0–0.2)

## 2019-09-01 LAB — TROPONIN I (HIGH SENSITIVITY)
Troponin I (High Sensitivity): <2 ng/L (ref <18)
Troponin I (High Sensitivity): <2 ng/L (ref <18)

## 2019-09-01 LAB — D-DIMER, QUANTITATIVE: D-Dimer, Quant: <0.27 ug/mL-FEU (ref 0.00–0.50)

## 2019-09-01 MED ORDER — OMEPRAZOLE 20 MG PO CPDR: DELAYED_RELEASE_CAPSULE | ORAL | 0 refills | Status: DC

## 2019-09-01 NOTE — ED Triage Notes (Addendum)
Pt c/o chest pain since 2200 tonight. Took TUMS at home thinking it was indigestion but pain never subsided. Pt given 324 ASA and 0.4mg  NTG SL en route. Pt also states she received her first dose of Moderna vaccine today.

## 2019-09-01 NOTE — Discharge Instructions (Signed)
Take the Prilosec as prescribed.  Please call Dr. Ival Bible office to be rechecked.  You had seen him in 2016 when you had chest pain.  He has a cardiologist.  Also consider seeing a gastroenterologist to check you to see if you have a hiatal hernia.  Please call Dr. Queen Blossom office he is the gastroenterologist on-call tonight.  Recheck if you get worse.

## 2019-09-01 NOTE — ED Provider Notes (Signed)
[facility] EMERGENCY DEPARTMENT Provider Note   CSN: [id number] Arrival date & time: 09/01/19  0056   Time seen 1:44 AM  History Chief Complaint  Patient presents with  . Chest Pain    Kathryn Oconnor is a 39 y.o. female.  HPI   Patient reports about 1045 this evening she was sitting outside talking to her husband and had onset of right central chest pain that radiated underneath her right breast and then across to her left chest.  She states she has had similar discomfort in the past that she thought was GERD but states the pain is always been sharp and not burning.  She tried eating a banana, taking Tums, and drinking a carbonated drink and the pain did not resolve.  EMS was called and they gave her aspirin and nitroglycerin x1 and her pain resolved.  She states her pain lasted about 2 hours.  It was constant until it left.  She states laying down made the pain worse, sitting up made it feel better.  She denies shortness of breath and states she was mildly sweaty and shaky.  She had some mild nausea but no vomiting.  She states that she took her first dose of Moderna Covid Vaccine this morning at 9:30 AM.  Risk factors this patient currently smokes half pack per day, her father has had an MI x1 and an enlarged heart.  She denies hypertension or high cholesterol and states she had gestational diabetes but is currently not treated for it.  PCP Merlyn AlbertLuking, William S, MD (Inactive)   Past Medical History:  Diagnosis Date  . Gestational diabetes    diet controlled  . Gestational diabetes mellitus (GDM), antepartum   . Hx of varicella   . Velamentous insertion of umbilical cord     Patient Active Problem List   Diagnosis Date Noted  . Postpartum care following vaginal delivery (12/11) 12/22/2014  . Atypical chest pain 02/13/2014  . Tobacco abuse 02/13/2014    Past Surgical History:  Procedure Laterality Date  . NO PAST SURGERIES       OB History    Gravida  2   Para  2    Term  2   Preterm      AB      Living  2     SAB      TAB      Ectopic      Multiple  0   Live Births  2           Family History  Problem Relation Age of Onset  . CAD Father   . Hypertension Father   . Heart disease Father   . CAD Maternal Uncle   . Migraines Mother   . Bipolar disorder Brother   . Hypertension Maternal Grandmother   . Cancer Maternal Grandfather        liver    Social History   Tobacco Use  . Smoking status: Current Every Day Smoker    Packs/day: 0.50    Types: Cigarettes    Start date: 12/09/1996  . Smokeless tobacco: Never Used  Substance Use Topics  . Alcohol use: No    Alcohol/week: 0.0 standard drinks  . Drug use: No   Home Medications Prior to Admission medications   Medication Sig Start Date End Date Taking? Authorizing Provider  cefdinir (OMNICEF) 300 MG capsule Take 1 capsule (300 mg total) by mouth 2 (two) times daily. 09/16/15   Merlyn AlbertLuking, William S, MD  FENUGREEK PO Take by mouth.    [provider]  levonorgestrel (MIRENA) 20 MCG/24HR IUD 1 each by Intrauterine route once.    [provider]  omeprazole (PRILOSEC) 20 MG capsule Take 1 po BID x 2 weeks then once a day 09/01/19   Devoria Albe, MD  Prenatal Multivit-Min-Fe-FA (PRENATAL VITAMINS PO) Take 1 each by mouth daily. Reported on 03/12/2015    [provider]    Allergies    Patient has no known allergies.  Review of Systems   Review of Systems  All other systems reviewed and are negative.   Physical Exam Updated Vital Signs BP 121/82   Pulse 80   Temp 98.6 F (37 C) (Oral)   Resp 15   Ht 5\' 3"  (1.6 m)   Wt 56.7 kg   SpO2 98%   BMI 22.14 kg/m   Physical Exam Vitals and nursing note reviewed.  Constitutional:      General: She is not in acute distress.    Appearance: Normal appearance. She is normal weight.  HENT:     Head: Normocephalic and atraumatic.     Right Ear: External ear normal.     Left Ear: External ear normal.    Eyes:     Extraocular Movements: Extraocular movements intact.     Conjunctiva/sclera: Conjunctivae normal.     Pupils: Pupils are equal, round, and reactive to light.  Cardiovascular:     Rate and Rhythm: Normal rate and regular rhythm.     Pulses: Normal pulses.     Heart sounds: Normal heart sounds. No murmur heard.  No friction rub.  Pulmonary:     Effort: Pulmonary effort is normal. No respiratory distress.     Breath sounds: Normal breath sounds.  Chest:     Chest wall: Tenderness present.    Abdominal:     General: Abdomen is flat. Bowel sounds are normal.     Palpations: Abdomen is soft.     Tenderness: There is no abdominal tenderness.  Musculoskeletal:     Cervical back: Normal range of motion.  Neurological:     Mental Status: She is alert.     ED Results / Procedures / Treatments   Labs (all labs ordered are listed, but only abnormal results are displayed) Results for orders placed or performed during the hospital encounter of 09/01/19  CBC  Result Value Ref Range   WBC 5.3 4.0 - 10.5 K/uL   RBC 3.94 3.87 - 5.11 MIL/uL   Hemoglobin 12.6 12.0 - 15.0 g/dL   HCT 09/03/19 36 - 46 %   MCV 98.2 80.0 - 100.0 fL   MCH 32.0 26.0 - 34.0 pg   MCHC 32.6 30.0 - 36.0 g/dL   RDW 76.1 60.7 - 37.1 %   Platelets 196 150 - 400 K/uL   nRBC 0.0 0.0 - 0.2 %  Comprehensive metabolic panel  Result Value Ref Range   Sodium 139 135 - 145 mmol/L   Potassium 3.5 3.5 - 5.1 mmol/L   Chloride 106 98 - 111 mmol/L   CO2 25 22 - 32 mmol/L   Glucose, Bld 88 70 - 99 mg/dL   BUN 13 6 - 20 mg/dL   Creatinine, Ser 06.2 0.44 - 1.00 mg/dL   Calcium 9.5 8.9 - 6.94 mg/dL   Total Protein 6.7 6.5 - 8.1 g/dL   Albumin 4.0 3.5 - 5.0 g/dL   AST 15 15 - 41 U/L   ALT 19 0 - 44 U/L  Alkaline Phosphatase 59 38 - 126 U/L   Total Bilirubin 0.3 0.3 - 1.2 mg/dL   GFR calc non Af Amer >60 >60 mL/min   GFR calc Af Amer >60 >60 mL/min   Anion gap 8 5 - 15  D-dimer, quantitative  Result Value Ref Range    D-Dimer, Quant <0.27 0.00 - 0.50 ug/mL-FEU  Troponin I (High Sensitivity)  Result Value Ref Range   Troponin I (High Sensitivity) <2 <18 ng/L  Troponin I (High Sensitivity)  Result Value Ref Range   Troponin I (High Sensitivity) <2 <18 ng/L   Laboratory interpretation all normal with normal delta troponin    EKG EKG Interpretation  Date/Time:  Saturday September 01 2019 01:05:10 EDT Ventricular Rate:  107 PR Interval:    QRS Duration: 84 QT Interval:  313 QTC Calculation: 418 R Axis:   90 Text Interpretation: Sinus tachycardia Probable left atrial enlargement Consider right ventricular hypertrophy No old tracing to compare Confirmed by Devoria Albe ([id number]) on 09/01/2019 1:12:08 AM   Radiology DG Chest 2 View  Result Date: 09/01/2019 CLINICAL DATA:  Chest pain EXAM: CHEST - 2 VIEW COMPARISON:  02/06/2014 FINDINGS: Lungs are clear. No pneumothorax or pleural effusion. Cardiac size within normal limits. Pulmonary vascularity normal. No acute bone abnormality. IMPRESSION: No active cardiopulmonary disease. Electronically Signed   By: Helyn Numbers MD   On: 09/01/2019 01:57    Procedures Procedures (including critical care time)    Echocardiogram 10/12/2004 CLINICAL DATA: 39 year old with murmur. M-mode aorta 2.1, left atrium 2.5, septum 0.9, posterior wall 0.7, LV diastole 3.7, LV systole 2.8. 1. Technically adequate echocardiographic study. 2. Normal left atrium, right atrium, and right ventricle. 3. Normal aortic, mitral, tricuspid, and pulmonic valves. 4. Normal Doppler study with physiologic tricuspid regurgitation and normal  estimated capital RV systolic pressure. 5. Normal proximal pulmonary artery. 6. Normal capital IVC. 7. Normal internal dimension, wall thickness, regional and global function  of the left ventricle.  Medications Ordered in ED Medications - No data to display  ED Course  I have reviewed the triage vital signs and the  nursing notes.  Pertinent labs & imaging results that were available during my care of the patient were reviewed by me and considered in my medical decision making (see chart for details).    MDM Rules/Calculators/A&P                         Patient states she is currently pain-free.  She was advised to let nursing staff know if her pain returned.  Laboratory testing was done.   Husband was here when I rechecked her around 4:00 and stated she had seen a cardiologist before and had a "floppy valve".  Review of her chart shows she was seen by Dr. Diona Browner in February 2016.  At that time she had had a echocardiogram which was normal.  She did have physiological tricuspid regurgitation.  We had already discussed following up with cardiology because she did have some minor risk factors for cardiac disease such as smoking or family history.  We also did discuss seeing a gastroenterologist to evaluate her to see if she has a hiatal hernia causing her atypical chest pain.  4:45 AM patient's delta troponin is negative.  She is remained pain-free.  She was discharged home.  Final Clinical Impression(s) / ED Diagnoses Final diagnoses:  Atypical chest pain    Rx / DC Orders ED Discharge Orders  Ordered    omeprazole (PRILOSEC) 20 MG capsule        09/01/19 0451          Plan discharge  Devoria Albe, MD, Concha Pyo, MD 09/01/19 [phone]

## 2019-09-04 ENCOUNTER — Telehealth: Payer: Self-pay | Admitting: Family Medicine

## 2019-09-04 NOTE — Telephone Encounter (Signed)
Pt needing to quarantine for the 10 days and if not having severe covid symptoms or more chest pain then can come in for visit after that.  If having more chest pain needs to go to ER.  Thx.   Dr. Ladona Ridgel

## 2019-09-04 NOTE — Telephone Encounter (Signed)
Patient calling for ER F/u appt for chest pains. (right side of chest).  Pt went to the ER Saturday night after getting Moderna vaccine that morning.  Was told to f/u with PCP.  Patient says she has taken 3 covid test, 2 at home and 1 thru employer and all 3 were positive.  How should we schedule this?  Call transferred to nurse for triage.

## 2019-09-04 NOTE — Telephone Encounter (Signed)
Patient has not had any chest pain since she was at the ER -- not having any sever covid symptoms.

## 2019-09-05 ENCOUNTER — Telehealth: Payer: Self-pay | Admitting: *Deleted

## 2019-09-05 NOTE — Telephone Encounter (Signed)
-----   Message from Lanelle Bal, DO sent at 09/05/2019  1:27 PM EDT ----- Can we set this patient up for office visit for epigastric pain ? The ER messaged me. Thank you

## 2019-09-05 NOTE — Telephone Encounter (Signed)
Kathryn Oconnor and Darl Pikes:  Can you schedule pt for ov per Dr. Marletta Lor if she calls back please?  She was seen in ER at [facility] for epigastric pain.

## 2019-09-05 NOTE — Telephone Encounter (Signed)
Patient notified of Dr. Taylor's recommendation and verbalized understanding.  

## 2019-09-06 NOTE — Telephone Encounter (Signed)
Pt called back and is aware of her OV

## 2019-09-07 ENCOUNTER — Telehealth (INDEPENDENT_AMBULATORY_CARE_PROVIDER_SITE_OTHER): Payer: 59 | Admitting: Family Medicine

## 2019-09-07 DIAGNOSIS — U071 COVID-19: Secondary | ICD-10-CM

## 2019-09-07 NOTE — Progress Notes (Addendum)
   Subjective:    Patient ID: Kathryn Oconnor, female    DOB: 03/26/1980, 39 y.o.   MRN: [id number] I connected with  Kathryn Oconnor on 10/01/19 by a video enabled telemedicine application and verified that I am speaking with the correct person using two identifiers.   I discussed the limitations of evaluation and management by telemedicine. The patient expressed understanding and agreed to proceed.  Virtual Visit via Video Note  I connected with Kathryn Oconnor on 10/01/19 at  3:00 PM EDT by a video enabled telemedicine application and verified that I am speaking with the correct person using two identifiers.  Location: Patient: Home Provider: Office   I discussed the limitations of evaluation and management by telemedicine and the availability of in person appointments. The patient expressed understanding and agreed to proceed.  History of Present Illness:    Observations/Objective:   Assessment and Plan:   Follow Up Instructions:    I discussed the assessment and treatment plan with the patient. The patient was provided an opportunity to ask questions and all were answered. The patient agreed with the plan and demonstrated an understanding of the instructions.   The patient was advised to call back or seek an in-person evaluation if the symptoms worsen or if the condition fails to improve as anticipated.  I provided 20 minutes of non-face-to-face time during this encounter.   Lilyan Punt, MD   HPI Patient tested positive last Saturday 09/01/19.  Experiencing congestion, cough, headaches, low grade fever.  Taking tylenol. Had 1st Moderna vaccine last Friday. Needs FMLA filled out.  Patient relates she had her first been during the vaccine and then later that night felt bad and the next day she felt even worse went   in patient relates some body aches headache difficulty smelling or tasting  Denies shortness of breath a test run which showed she was positive for  Covid. Review of Systems See above    Objective:   Physical Exam   Patient had virtual visit Appears to be in no distress Atraumatic Neuro able to relate and oriented No apparent resp distress Color normal Video visit     Assessment & Plan:  Positive Covid infection Warning signs were discussed No need to go to ER currently If patient has progressive shortness of breath or O2 saturations drop into the 80s she will need to go to the ER she understands this Self-isolation minimum 10 days because of her current work issues and health issues patient will need to be out of work the rest of this week and potentially the rest the next week too

## 2019-09-11 ENCOUNTER — Telehealth: Payer: Self-pay | Admitting: Family Medicine

## 2019-09-11 NOTE — Telephone Encounter (Signed)
According to CDC guidelines she is no longer considered contagious Obviously the patient should still be prudent and wear a mask when she is around people such as work, Doctor, general practice. May use Delsym as needed If return of fever or significant shortness of breath recommend to be seen here or ER

## 2019-09-11 NOTE — Telephone Encounter (Signed)
Pt wants to know if she is still contagious. Tested positive for covid on 8/21. Saw dr Lorin Picket on 8/27. Feels ok but still having cough and congestion. Would like to know what to take otc. No fever in the past 5 days.

## 2019-09-11 NOTE — Telephone Encounter (Signed)
Patient tested positive on 8/21 and she states been 10 days,she has no fever,no new symptoms but wants to know if she is still contiguous.She states still has congestion and cough can you call in something for it/ [employer]

## 2019-09-11 NOTE — Telephone Encounter (Signed)
Patient advise per Dr Lorin Picket: According to [facility] guidelines she is no longer considered contagious Obviously the patient should still be prudent and wear a mask when she is around people such as work, Doctor, general practice. May use Delsym as needed If return of fever or significant shortness of breath recommend to be seen here or ER Patient verbalized understanding.

## 2019-09-18 ENCOUNTER — Ambulatory Visit: Payer: 59 | Admitting: Family Medicine

## 2019-09-18 ENCOUNTER — Encounter: Payer: Self-pay | Admitting: Family Medicine

## 2019-09-18 ENCOUNTER — Telehealth: Payer: Self-pay | Admitting: Family Medicine

## 2019-09-18 ENCOUNTER — Other Ambulatory Visit: Payer: Self-pay

## 2019-09-18 VITALS — Temp 97.9°F | Wt 120.0 lb

## 2019-09-18 DIAGNOSIS — U071 COVID-19: Secondary | ICD-10-CM

## 2019-09-18 DIAGNOSIS — R079 Chest pain, unspecified: Secondary | ICD-10-CM | POA: Diagnosis not present

## 2019-09-18 NOTE — Telephone Encounter (Signed)
Pt seen Dr Lorin Picket on 09/18/2019 he completed a form for Pt to return to work on 09/18/2019 but her employer has requested another form to be completed. Placed in Dr Alverda Skeans

## 2019-09-18 NOTE — Progress Notes (Signed)
   Subjective:    Patient ID: Kathryn Oconnor, female    DOB: January 08, 1981, 39 y.o.   MRN: [id number]  HPI  Patient arrives for a follow up on Covid. Patient also having follow up for ER visit for vaccine reaction. Patient states she had her first Moderna Covid shot 08/31/19 and ended up in the ER with chest pains. I talked with the patient how it sounds like she came down with Covid at the same time and it is quite possible Covid could cause the chest pains although it is also possible that the vaccine could cause chest pains.  She states since then she has been having ongoing intermittent chest pains.  I told the patient that according to my knowledge when vaccines cause chest pain is typically temporary 1 to 2 days which go away the fact that she has been having ongoing trouble with this points more toward a possible long-term consequence of the recent Covid infection.  She did have work-up in the ER which included lab work which was negative for D-dimer negative for troponin she denies any tachycardia severe shortness of breath Review of Systems  Constitutional: Negative for activity change, appetite change and fatigue.  HENT: Negative for congestion and rhinorrhea.   Respiratory: Negative for cough and shortness of breath.   Cardiovascular: Positive for chest pain. Negative for leg swelling.  Gastrointestinal: Negative for abdominal pain and diarrhea.  Endocrine: Negative for polydipsia and polyphagia.  Skin: Negative for color change.  Neurological: Negative for dizziness and weakness.  Psychiatric/Behavioral: Negative for behavioral problems and confusion.       Objective:   Physical Exam Lungs clear respiratory rate normal heart is regular no murmurs pulses are normal BP good extremities no edema skin warm dry       Assessment & Plan:  Chest pain Concerning for possible inflammation of the heart possibly related to vaccine possibly related to recent Covid.  Because of  reoccurring chest pains repeat D-dimer repeat troponin in addition to this C-reactive protein also in addition to this I recommend echo to look at heart muscle motion and cardiology consult.  In my opinion it would be in the patient's best interest to do her second vaccine in 3 months but we will get cardiology's opinion whether or not the patient should do this or not patient is concerned she feels that her chest pains was related to the vaccine which is quite possible I did explain to the patient that this can temporarily occur with vaccines in is still recommended to be the second vaccine but she would appreciate seeing cardiology and getting their opinion which I am 100% agreeing with

## 2019-09-19 LAB — HEPATIC FUNCTION PANEL
ALT: 20 IU/L (ref 0–32)
AST: 14 IU/L (ref 0–40)
Albumin: 4.3 g/dL (ref 3.8–4.8)
Alkaline Phosphatase: 75 IU/L (ref 48–121)
Bilirubin Total: 0.2 mg/dL (ref 0.0–1.2)
Bilirubin, Direct: 0.06 mg/dL (ref 0.00–0.40)
Total Protein: 6.6 g/dL (ref 6.0–8.5)

## 2019-09-19 LAB — AMYLASE: Amylase: 38 U/L (ref 31–110)

## 2019-09-19 LAB — D-DIMER, QUANTITATIVE: D-DIMER: 0.3 mg/L FEU (ref 0.00–0.49)

## 2019-09-19 LAB — LIPASE: Lipase: 29 U/L (ref 14–72)

## 2019-09-19 LAB — C-REACTIVE PROTEIN: CRP: 2 mg/L (ref 0–10)

## 2019-09-19 LAB — TROPONIN T: Troponin T (Highly Sensitive): <6 ng/L (ref 0–14)

## 2019-09-19 NOTE — Telephone Encounter (Signed)
Patient had disability forms faxed over to be completed in your folder.

## 2019-09-20 ENCOUNTER — Encounter: Payer: Self-pay | Admitting: Family Medicine

## 2019-09-20 DIAGNOSIS — Z029 Encounter for administrative examinations, unspecified: Secondary | ICD-10-CM

## 2019-09-20 NOTE — Telephone Encounter (Signed)
Forms were completed thank you please fill out the administrative portions please Notify patient they are ready

## 2019-09-28 ENCOUNTER — Other Ambulatory Visit: Payer: Self-pay | Admitting: *Deleted

## 2019-09-28 DIAGNOSIS — R079 Chest pain, unspecified: Secondary | ICD-10-CM

## 2019-10-10 ENCOUNTER — Ambulatory Visit (HOSPITAL_COMMUNITY)
Admission: RE | Admit: 2019-10-10 | Discharge: 2019-10-10 | Disposition: A | Discharge status: Home / Self Care | Payer: 59 | Source: Ambulatory Visit | Attending: Family Medicine | Admitting: Family Medicine

## 2019-10-10 ENCOUNTER — Other Ambulatory Visit: Payer: Self-pay

## 2019-10-10 DIAGNOSIS — R079 Chest pain, unspecified: Secondary | ICD-10-CM | POA: Diagnosis present

## 2019-10-10 LAB — ECHOCARDIOGRAM COMPLETE
Area-P 1/2: 3.17 cm2
S' Lateral: 2.32 cm

## 2019-10-10 NOTE — Progress Notes (Signed)
*  PRELIMINARY RESULTS* Echocardiogram 2D Echocardiogram has been performed.  Stacey Drain 10/10/2019, 10:35 AM

## 2019-10-24 ENCOUNTER — Ambulatory Visit: Payer: 59 | Admitting: Gastroenterology

## 2019-11-08 ENCOUNTER — Ambulatory Visit: Payer: 59 | Admitting: Cardiology

## 2019-11-08 ENCOUNTER — Encounter: Payer: Self-pay | Admitting: Cardiology

## 2019-11-08 VITALS — BP 128/78 | HR 90 | Ht 63.0 in | Wt 120.4 lb

## 2019-11-08 DIAGNOSIS — R002 Palpitations: Secondary | ICD-10-CM

## 2019-11-08 DIAGNOSIS — R0789 Other chest pain: Secondary | ICD-10-CM | POA: Diagnosis not present

## 2019-11-08 NOTE — Patient Instructions (Signed)
Your physician recommends that you schedule a follow-up appointment in: PENDING WITH DR [facility]  Your physician recommends that you continue on your current medications as directed. Please refer to the Current Medication list given to you today.  Your physician has recommended that you wear an event monitor FOR 1 WEEK. Event monitors are medical devices that record the heart's electrical activity. Doctors most often [place] these monitors to diagnose arrhythmias. Arrhythmias are problems with the speed or rhythm of the heartbeat. The monitor is a small, portable device. You can wear one while you do your normal daily activities. This is usually used to diagnose what is causing palpitations/syncope (passing out).  /Thank you for choosing [facility]

## 2019-11-08 NOTE — Progress Notes (Signed)
Clinical Summary Kathryn Oconnor is a 39 y.o.female seen as a new consult, referred by Dr Ladona Ridgel for chest pain.   1. Chest pain ER visit 08/2019 with chest pain  - ER description    Patient reports about 1045 this evening she was sitting outside talking to her husband and had onset of right central chest pain that radiated underneath her right breast and then across to her left chest.  She states she has had similar discomfort in the past that she thought was GERD but states the pain is always been sharp and not burning.  She tried eating a banana, taking Tums, and drinking a carbonated drink and the pain did not resolve.  EMS was called and they gave her aspirin and nitroglycerin x1 and her pain resolved.  She states her pain lasted about 2 hours.  It was constant until it left.  She states laying down made the pain worse, sitting up made it feel better.  She denies shortness of breath and states she was mildly sweaty and shaky.  She had some mild nausea but no vomiting.  She states that she took her first dose of Moderna Covid Vaccine this morning at 9:30 AM. - she had moderna vaccine. Diaphoretic   Ddimer neg, CRP neg, neg troponin CXR no acute process EKG mild sinus tach, no acute ischemic changes - 09/2019 echo LVEF 60-65%, no WMAs  - no recurrent pains since     2. Palpitaitons - on and off for a few years - recently increased. Few times a week, last a few minutes - no specifiic trigger - coffee x 4 cups, caffeeine sodas, no tea, no energy, no EtOH    3. Prior COVID infection -works in assisted living.  - diagnosed with COVID in August - her job has mandated vaccine  SH: has 2 kids, 4 and 11.   Past Medical History:  Diagnosis Date  . Gestational diabetes    diet controlled  . Gestational diabetes mellitus (GDM), antepartum   . Hx of varicella   . Velamentous insertion of umbilical cord      No Known Allergies   Current Outpatient Medications  Medication  Sig Dispense Refill  . levonorgestrel (MIRENA) 20 MCG/24HR IUD 1 each by Intrauterine route once.     No current facility-administered medications for this visit.     Past Surgical History:  Procedure Laterality Date  . NO PAST SURGERIES       No Known Allergies    Family History  Problem Relation Age of Onset  . CAD Father   . Hypertension Father   . Heart disease Father   . CAD Maternal Uncle   . Migraines Mother   . Bipolar disorder Brother   . Hypertension Maternal Grandmother   . Cancer Maternal Grandfather        liver     Social History Ms. Randa reports that she has been smoking cigarettes. She started smoking about 22 years ago. She has been smoking about 0.50 packs per day. She has never used smokeless tobacco. Ms. Norgard reports no history of alcohol use.   Review of Systems CONSTITUTIONAL: No weight loss, fever, chills, weakness or fatigue.  HEENT: Eyes: No visual loss, blurred vision, double vision or yellow sclerae.No hearing loss, sneezing, congestion, runny nose or sore throat.  SKIN: No rash or itching.  CARDIOVASCULAR: per hpi RESPIRATORY: No shortness of breath, cough or sputum.  GASTROINTESTINAL: No anorexia, nausea, vomiting or diarrhea. No  abdominal pain or blood.  GENITOURINARY: No burning on urination, no polyuria NEUROLOGICAL: No headache, dizziness, syncope, paralysis, ataxia, numbness or tingling in the extremities. No change in bowel or bladder control.  MUSCULOSKELETAL: No muscle, back pain, joint pain or stiffness.  LYMPHATICS: No enlarged nodes. No history of splenectomy.  PSYCHIATRIC: No history of depression or anxiety.  ENDOCRINOLOGIC: No reports of sweating, cold or heat intolerance. No polyuria or polydipsia.  Marland Kitchen   Physical Examination Today's Vitals   11/08/19 0920  BP: 128/78  Pulse: 90  SpO2: 96%  Weight: 120 lb 6.4 oz (54.6 kg)  Height: 5\' 3"  (1.6 m)   Body mass index is 21.33 kg/m.  Gen: resting comfortably,  no acute distress HEENT: no scleral icterus, pupils equal round and reactive, no palptable cervical adenopathy,  CV: RRR, no m/r/g, no jvd Resp: Clear to auscultation bilaterally GI: abdomen is soft, non-tender, non-distended, normal bowel sounds, no hepatosplenomegaly MSK: extremities are warm, no edema.  Skin: warm, no rash Neuro:  no focal deficits Psych: appropriate affect      Assessment and Plan  1. Palpitations - will obtain a 7 day event monitor to further evaluate - EKG today shows NSR  2. Chest pain - isolated episode on day of moderna covid vaccine - negative ER workup. With benign EKG and enzymes no evidence of myocarditis at that time - from a purely cardiac standpoint there is not a contrainidciation to completing her vaccine series      F/u pending monitor results   , M.D.

## 2019-11-15 ENCOUNTER — Encounter (INDEPENDENT_AMBULATORY_CARE_PROVIDER_SITE_OTHER): Payer: 59

## 2019-11-15 DIAGNOSIS — R002 Palpitations: Secondary | ICD-10-CM | POA: Diagnosis not present

## 2019-12-10 ENCOUNTER — Telehealth: Payer: Self-pay | Admitting: Cardiology

## 2019-12-10 DIAGNOSIS — R0789 Other chest pain: Secondary | ICD-10-CM

## 2019-12-10 NOTE — Telephone Encounter (Signed)
New message   Patient calling for monitor results 

## 2019-12-10 NOTE — Telephone Encounter (Signed)
Pt notified that results are not available to nursing staff at this time. Pt notified that we will call as soon as they are resulted.

## 2019-12-19 NOTE — Telephone Encounter (Signed)
Can we do a 7 day ziopatch instead since had issues with the preventice monitor  Dominga Ferry MD

## 2019-12-19 NOTE — Telephone Encounter (Signed)
Pt is calling wanting to know if she needs to wear a monitor again, since there were issues with the first one.   Please call [phone]

## 2019-12-19 NOTE — Addendum Note (Signed)
Addended by: Roseanne Reno on: 12/19/2019 03:33 PM   Modules accepted: Orders

## 2019-12-19 NOTE — Telephone Encounter (Signed)
Pt called back and was agreeable to [facility] monitor. Will enroll as patient prefers monitor shipped to her house.

## 2019-12-19 NOTE — Telephone Encounter (Signed)
Does she need to wear monitor again?   Margaretha Sheffield, RN  12/19/2019 9:52 AM EST     Results discussed with patient. Patient advised per Dr Ladona Ridgel: Call back to Dr. Verna Czech office to see if they want her to repeat the holter monitor since didn't work correctly. And to follow up with them if having more chest pain.  Have pt go ahead with the 2nd covid vaccine, doesn't seem to show any concern for enlarged heart or infection of the heart to be a contraindication of the vaccine. Patient verbalized understanding and will contact Dr Verna Czech office concerning the heart monitor.   Malena Orie Fisherman, DO  12/19/2019 9:07 AM EST     Call back to Dr. Verna Czech office to see if they want her to repeat the holter monitor since didn't work correctly. And to follow up with them if having more chest pain. Have pt go ahead with the 2nd covid vaccine, doesn't seem to show any concern for enlarged heart or infection of the heart to be a contraindication of the vaccine.  Take care, Dr. Arlyce Dice, LPN  17/04/812 4:81 PM EST     Pt returned call. Pt states that she did have one episode of chest pain.Pt states that Dr.Branch gave results of heart monitor last week. Pt had issues with cell phone that was with monitor. She did call the company and they could send a new machine but pt states she was only doing the test for 7 days. Pt would like to do whatever the doctor thinks is best. Pt has had COVID. Pt is coming up on 90 day to take 2nd vaccine (12/24/19) and is needing advice on what to do. 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 Pt states she got the shot on Friday and was diagnosed with COVID the next.    Metro Kung, LPN  85/06/3147 7:02 PM EST     Left message to return call    Kerney Elbe, LPN  63/07/8586 50:27 PM EST     Pt notified and states that she did have trouble with phone that came with he monitor. Pt states that she did have one  episode of chest pain while wearing monitor. Pleases advise.

## 2020-03-03 ENCOUNTER — Telehealth: Payer: Self-pay

## 2020-03-03 DIAGNOSIS — Z029 Encounter for administrative examinations, unspecified: Secondary | ICD-10-CM

## 2020-03-03 NOTE — Telephone Encounter (Signed)
Patient has appointment in the morning by phone to complete FMLA for Covid from 2/5-2/10. In your box for you to complete.

## 2020-03-04 ENCOUNTER — Other Ambulatory Visit: Payer: Self-pay

## 2020-03-04 ENCOUNTER — Encounter: Payer: Self-pay | Admitting: Family Medicine

## 2020-03-04 ENCOUNTER — Telehealth (INDEPENDENT_AMBULATORY_CARE_PROVIDER_SITE_OTHER): Payer: 59 | Admitting: Family Medicine

## 2020-03-04 DIAGNOSIS — U099 Post covid-19 condition, unspecified: Secondary | ICD-10-CM | POA: Diagnosis not present

## 2020-03-04 DIAGNOSIS — U071 COVID-19: Secondary | ICD-10-CM | POA: Insufficient documentation

## 2020-03-04 NOTE — Progress Notes (Signed)
   Patient ID: Kathryn Oconnor, female    DOB: 1980-02-24, 40 y.o.   MRN: [id number]   Chief Complaint  Patient presents with  . Covid Positive   Subjective:  CC: post-Covid FMLA paperwork for employer  This is not a new problem.  Presents today via telephone visit following recent Covid infection to assist provider with filling out FMLA paperwork for her employment.  Patient reports that her son tested positive for Covid on February 4, due to employer requirement she had to test that time she was positive with no symptoms.  Reports that she was entirely asymptomatic throughout the entire illness.  She returned to work on February 21, 2020.  This paperwork is required for her return to work.  She denies fever, chills, chest pain, shortness of breath, any symptoms from Covid infection.   pt tested positive for covid on 2/4. Need FMLA papers filled out.   Virtual Visit via Telephone Note  I connected with Kathryn Oconnor on 03/04/20 at  9:00 AM EST by telephone and verified that I am speaking with the correct person using two identifiers.  Location: Patient: home Provider: office   I discussed the limitations, risks, security and privacy concerns of performing an evaluation and management service by telephone and the availability of in person appointments. I also discussed with the patient that there may be a patient responsible charge related to this service. The patient expressed understanding and agreed to proceed.   History of Present Illness:    Observations/Objective:   Assessment and Plan:   Follow Up Instructions:    I discussed the assessment and treatment plan with the patient. The patient was provided an opportunity to ask questions and all were answered. The patient agreed with the plan and demonstrated an understanding of the instructions.   The patient was advised to call back or seek an in-person evaluation if the symptoms worsen or if the condition fails to  improve as anticipated.  I provided 14 minutes of non-face-to-face time during this encounter.     Medical History Zykiria has a past medical history of Gestational diabetes, Gestational diabetes mellitus (GDM), antepartum, varicella, and Velamentous insertion of umbilical cord.   Outpatient Encounter Medications as of 03/04/2020  Medication Sig  . levonorgestrel (MIRENA) 20 MCG/24HR IUD 1 each by Intrauterine route once.   No facility-administered encounter medications on file as of 03/04/2020.     Review of Systems  Constitutional: Negative for chills, fatigue and fever.  HENT: Negative for congestion, sinus pressure, sinus pain and sore throat.   Respiratory: Negative for cough, chest tightness and shortness of breath.   Cardiovascular: Negative for chest pain.  Gastrointestinal: Negative for abdominal pain, diarrhea, nausea and vomiting.  Musculoskeletal: Negative for myalgias.  Neurological: Negative for headaches.     Vitals There were no vitals taken for this visit. unable Objective:   Physical Exam  No physical exam, able to converse throughout telephone visit without obvious shortness of breath. 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  Returned to work on February 21, 2020.  Visit today goal to complete FMLA paperwork as required by her employer.  No treatment needed.  Agrees with plan of care discussed today. Understands warning signs to seek further care: chest pain, shortness of breath, any significant change in health.  Understands to follow-up if needed.   Dorena Bodo, NP 03/04/2020

## 2020-03-28 ENCOUNTER — Other Ambulatory Visit: Payer: Self-pay

## 2020-03-28 ENCOUNTER — Ambulatory Visit: Payer: 59 | Admitting: Family Medicine

## 2020-03-28 ENCOUNTER — Encounter: Payer: Self-pay | Admitting: Family Medicine

## 2020-03-28 VITALS — BP 122/74 | HR 90 | Temp 98.8°F | Ht 63.0 in | Wt 117.0 lb

## 2020-03-28 DIAGNOSIS — H11422 Conjunctival edema, left eye: Secondary | ICD-10-CM | POA: Insufficient documentation

## 2020-03-28 NOTE — Patient Instructions (Signed)
OTC allergy eye drops for itching and follow-up with opthalmology.

## 2020-03-28 NOTE — Progress Notes (Signed)
   Patient ID: Kathryn Oconnor, female    DOB: [birth year], 40 y.o.   MRN: [id number]   No chief complaint on file.  Subjective:  CC: left sclera blister  This is not a new problem.  Presents today with a complaint of having a blister on her left sclera.  Reports that she had COVID infection in August, this blister like area appeared after that time.  Denies any eye pain, no blurry vision, does feel like this area has increased in size, because it is more noticeable when she blinks.  She attempted to make an appointment at the ophthalmologist, she was not an established patient, appointment availability was August, they report that if she gets a referral from primary care they may get her in earlier.  She presents today for this referral.  Denies fever, chills, chest pain, shortness of breath, endorses some redness in the left eye.   blister on left sclera. States it has been there since having covid in august.    Medical History Seila has a past medical history of Gestational diabetes, Gestational diabetes mellitus (GDM), antepartum, varicella, and Velamentous insertion of umbilical cord.   Outpatient Encounter Medications as of 03/28/2020  Medication Sig  . levonorgestrel (MIRENA) 20 MCG/24HR IUD 1 each by Intrauterine route once.   No facility-administered encounter medications on file as of 03/28/2020.     Review of Systems  Constitutional: Negative for chills and fever.  HENT: Negative for ear pain.   Eyes: Positive for redness. Negative for photophobia, pain, discharge, itching and visual disturbance.  Respiratory: Negative for shortness of breath.   Cardiovascular: Negative for chest pain.  Neurological: Negative for dizziness, light-headedness and headaches.     Vitals BP 122/74   Pulse 90   Temp 98.8 F (37.1 C)   Ht 5\' 3"  (1.6 m)   Wt 117 lb (53.1 kg)   SpO2 99%   Breastfeeding No   BMI 20.73 kg/m   Objective:   Physical Exam Vitals reviewed.   Constitutional:      Appearance: Normal appearance.  Eyes:     General: No visual field deficit.       Left eye: No foreign body, discharge or hordeolum.     Conjunctiva/sclera:     Left eye: Chemosis present. No exudate or hemorrhage. Cardiovascular:     Rate and Rhythm: Normal rate and regular rhythm.     Heart sounds: Normal heart sounds.  Pulmonary:     Effort: Pulmonary effort is normal.     Breath sounds: Normal breath sounds.  Skin:    General: Skin is warm and dry.  Neurological:     General: No focal deficit present.     Mental Status: She is alert.  Psychiatric:        Behavior: Behavior normal.      Assessment and Plan   1. Chemosis of left conjunctiva - Ambulatory referral to Ophthalmology   Recommend OTC allergy medication eyedrops if itching is bothersome.  Consulted with Dr. , she assessed the patient's eye alongside the NP, no worrisome findings.  Ophthalmology referral sent and encouraged over-the-counter allergy medication for itching.  Agrees with plan of care discussed today. Understands warning signs to seek further care: chest pain, shortness of breath, any significant change in health.  Understands to follow-up with ophthalmology, at this office if needed.   Ladona Ridgel, NP 03/28/2020

## 2020-04-01 ENCOUNTER — Telehealth: Payer: Self-pay | Admitting: Family Medicine

## 2022-02-03 ENCOUNTER — Ambulatory Visit (INDEPENDENT_AMBULATORY_CARE_PROVIDER_SITE_OTHER): Payer: 59 | Admitting: Family Medicine

## 2022-02-03 VITALS — BP 116/78 | HR 105 | Temp 98.8°F | Ht 62.75 in | Wt 115.6 lb

## 2022-02-03 DIAGNOSIS — Z130 Encounter for screening for diseases of the blood and blood-forming organs and certain disorders involving the immune mechanism: Secondary | ICD-10-CM

## 2022-02-03 DIAGNOSIS — Z720 Tobacco use: Secondary | ICD-10-CM

## 2022-02-03 DIAGNOSIS — Z13220 Encounter for screening for lipoid disorders: Secondary | ICD-10-CM

## 2022-02-03 DIAGNOSIS — Z0000 Encounter for general adult medical examination without abnormal findings: Secondary | ICD-10-CM

## 2022-02-03 NOTE — Progress Notes (Signed)
Subjective:  Patient ID: Kathryn Oconnor, female    DOB: 24-Apr-1980  Age: 42 y.o. MRN: [id number]  CC: Physical   HPI:  42 year old female presents for an annual physical exam.  Patient states that she is doing well.  Has no complaints or concerns at this time.  Patient is followed by OB/GYN.  IUD in place.  She states that she is followed by [facility].  Last Pap smear was in 2023.  Patient's last tetanus is not documented.  Patient needs screening labs.  She is a every day smoker.  We discussed smoking cessation today.  Patient Active Problem List   Diagnosis Date Noted   Annual physical exam 02/03/2022   Tobacco abuse 02/13/2014    Social Hx   Social History   Socioeconomic History   Marital status: Married    Spouse name: Not on file   Number of children: Not on file   Years of education: Not on file   Highest education level: Not on file  Occupational History   Not on file  Tobacco Use   Smoking status: Every Day    Packs/day: 0.50    Types: Cigarettes    Start date: 12/09/1996   Smokeless tobacco: Never  Substance and Sexual Activity   Alcohol use: No    Alcohol/week: 0.0 standard drinks of alcohol   Drug use: No   Sexual activity: Yes    Partners: Male  Other Topics Concern   Not on file  Social History Narrative   Not on file   Social Determinants of Health   Financial Resource Strain: Not on file  Food Insecurity: Not on file  Transportation Needs: Not on file  Physical Activity: Not on file  Stress: Not on file  Social Connections: Not on file    Review of Systems  Constitutional: Negative.   Respiratory: Negative.    Cardiovascular: Negative.    Objective:  BP 116/78   Pulse (!) 105   Temp 98.8 F (37.1 C) (Oral)   Ht 5' 2.75" (1.594 m)   Wt 115 lb 9.6 oz (52.4 kg)   SpO2 99%   BMI 20.64 kg/m      02/03/2022    9:09 AM 03/28/2020    1:31 PM 11/08/2019    9:20 AM  BP/Weight  Systolic BP 606 301 601  Diastolic BP 78 74 78   Wt. (Lbs) 115.6 117 120.4  BMI 20.64 kg/m2 20.73 kg/m2 21.33 kg/m2    Physical Exam Vitals and nursing note reviewed.  Constitutional:      General: She is not in acute distress.    Appearance: Normal appearance.  HENT:     Head: Normocephalic and atraumatic.  Eyes:     General:        Right eye: No discharge.        Left eye: No discharge.     Conjunctiva/sclera: Conjunctivae normal.  Cardiovascular:     Rate and Rhythm: Normal rate and regular rhythm.  Pulmonary:     Effort: Pulmonary effort is normal.     Breath sounds: Normal breath sounds. No wheezing, rhonchi or rales.  Neurological:     Mental Status: She is alert.  Psychiatric:        Mood and Affect: Mood normal.        Behavior: Behavior normal.     Lab Results  Component Value Date   WBC 5.3 09/01/2019   HGB 12.6 09/01/2019   HCT 38.7 09/01/2019   PLT  196 09/01/2019   GLUCOSE 88 09/01/2019   ALT 20 09/18/2019   AST 14 09/18/2019   NA 139 09/01/2019   K 3.5 09/01/2019   CL 106 09/01/2019   CREATININE 0.61 09/01/2019   BUN 13 09/01/2019   CO2 25 09/01/2019   TSH 1.630 03/12/2015   HGBA1C 4.9 03/12/2015     Assessment & Plan:   Problem List Items Addressed This Visit       Other   Annual physical exam - Primary    Pap smear up-to-date.  Patient is getting her mammograms at OB/GYN office as well. Patient will work on smoking cessation. Labs today.      Tobacco abuse   Relevant Orders   CMP14+EGFR   Other Visit Diagnoses     Screening for deficiency anemia       Relevant Orders   CBC   Screening, lipid       Relevant Orders   Lipid panel      Follow-up:  Return in about 1 year (around 02/04/2023).  [place]

## 2022-02-03 NOTE — Patient Instructions (Signed)
Labs today.  Follow up annually.  Take care  Dr. Elliett Guarisco  

## 2022-02-03 NOTE — Assessment & Plan Note (Signed)
Pap smear up-to-date.  Patient is getting her mammograms at OB/GYN office as well. Patient will work on smoking cessation. Labs today.

## 2022-02-04 LAB — CMP14+EGFR
ALT: 18 IU/L (ref 0–32)
AST: 16 IU/L (ref 0–40)
Albumin/Globulin Ratio: 2.9 — ABNORMAL HIGH (ref 1.2–2.2)
Albumin: 4.7 g/dL (ref 3.9–4.9)
Alkaline Phosphatase: 70 IU/L (ref 44–121)
BUN/Creatinine Ratio: 17 (ref 9–23)
BUN: 11 mg/dL (ref 6–24)
Bilirubin Total: 0.4 mg/dL (ref 0.0–1.2)
CO2: 21 mmol/L (ref 20–29)
Calcium: 9.2 mg/dL (ref 8.7–10.2)
Chloride: 105 mmol/L (ref 96–106)
Creatinine, Ser: 0.63 mg/dL (ref 0.57–1.00)
Globulin, Total: 1.6 g/dL (ref 1.5–4.5)
Glucose: 88 mg/dL (ref 70–99)
Potassium: 4.2 mmol/L (ref 3.5–5.2)
Sodium: 141 mmol/L (ref 134–144)
Total Protein: 6.3 g/dL (ref 6.0–8.5)
eGFR: 113 mL/min/{1.73_m2} (ref >59)

## 2022-02-04 LAB — LIPID PANEL
Chol/HDL Ratio: 2.9 ratio (ref 0.0–4.4)
Cholesterol, Total: 165 mg/dL (ref 100–199)
HDL: 56 mg/dL (ref >39)
LDL Chol Calc (NIH): 97 mg/dL (ref 0–99)
Triglycerides: 59 mg/dL (ref 0–149)
VLDL Cholesterol Cal: 12 mg/dL (ref 5–40)

## 2022-02-04 LAB — CBC
Hematocrit: 42.3 % (ref 34.0–46.6)
Hemoglobin: 14.1 g/dL (ref 11.1–15.9)
MCH: 32.1 pg (ref 26.6–33.0)
MCHC: 33.3 g/dL (ref 31.5–35.7)
MCV: 96 fL (ref 79–97)
Platelets: 262 10*3/uL (ref 150–450)
RBC: 4.39 x10E6/uL (ref 3.77–5.28)
RDW: 12.8 % (ref 11.7–15.4)
WBC: 7.7 10*3/uL (ref 3.4–10.8)

## 2023-11-28 ENCOUNTER — Ambulatory Visit (INDEPENDENT_AMBULATORY_CARE_PROVIDER_SITE_OTHER): Payer: Self-pay | Admitting: Family Medicine

## 2023-11-28 VITALS — BP 136/73 | HR 116 | Temp 98.2°F | Ht 62.75 in | Wt 121.0 lb

## 2023-11-28 DIAGNOSIS — Z0001 Encounter for general adult medical examination with abnormal findings: Secondary | ICD-10-CM

## 2023-11-28 DIAGNOSIS — F430 Acute stress reaction: Secondary | ICD-10-CM | POA: Diagnosis not present

## 2023-11-28 DIAGNOSIS — Z0000 Encounter for general adult medical examination without abnormal findings: Secondary | ICD-10-CM

## 2023-11-28 NOTE — Patient Instructions (Signed)
 Labs ordered.  Message with concerns.  Follow up annually or sooner if needed.

## 2023-11-28 NOTE — Progress Notes (Signed)
 Subjective:  Patient ID: Kathryn Oconnor, female    DOB: [birth year]  Age: 43 y.o. MRN: [id number]  CC:   Annual exam  HPI:  43 year old female presents for an annual exam.  Patient's cervical cancer screening and mammogram are up-to-date.  This is handled through [facility].  Declines immunizations today.  Flu vaccine is up-to-date.  Needs labs.  She reports that she is experiencing a lot of stress currently.  She is having marital issues.  Her husband has recently moved out.  They have 2 children, 9-year-old daughter and a 22 year old son.  They are going to counseling and trying to work it out.  Additionally, she is currently selling her house and this is also stressful.  Flowsheet Row Office Visit from 11/28/2023 in [facility]  PHQ-9 Total Score 4      11/28/2023    9:06 AM 02/03/2022    9:25 AM  GAD 7 : Generalized Anxiety Score  Nervous, Anxious, on Edge 2 0  Control/stop worrying 2 0  Worry too much - different things 2 0  Trouble relaxing 0 0  Restless 0 0  Easily annoyed or irritable 2 0  Afraid - awful might happen 2 0  Total GAD 7 Score 10 0  Anxiety Difficulty Not difficult at all       Patient Active Problem List   Diagnosis Date Noted   Stress reaction 11/28/2023   Annual physical exam 02/03/2022   Tobacco abuse 02/13/2014    Social Hx   Social History   Socioeconomic History   Marital status: Married    Spouse name: Not on file   Number of children: Not on file   Years of education: Not on file   Highest education level: Bachelor's degree (e.g., BA, AB, BS)  Occupational History   Not on file  Tobacco Use   Smoking status: Every Day    Current packs/day: 0.50    Average packs/day: 0.5 packs/day for 27.0 years (13.5 ttl pk-yrs)    Types: Cigarettes    Start date: 12/09/1996   Smokeless tobacco: Never  Substance and Sexual Activity   Alcohol use: No    Alcohol/week: 0.0 standard drinks of alcohol   Drug use:  No   Sexual activity: Yes    Partners: Male  Other Topics Concern   Not on file  Social History Narrative   Not on file   Social Drivers of Health   Financial Resource Strain: Low Risk  (11/28/2023)   Overall Financial Resource Strain (CARDIA)    Difficulty of Paying Living Expenses: Not hard at all  Food Insecurity: No Food Insecurity (11/28/2023)   Hunger Vital Sign    Worried About Running Out of Food in the Last Year: Never true    Ran Out of Food in the Last Year: Never true  Transportation Needs: No Transportation Needs (11/28/2023)   PRAPARE - Administrator, Civil Service (Medical): No    Lack of Transportation (Non-Medical): No  Physical Activity: Inactive (11/28/2023)   Exercise Vital Sign    Days of Exercise per Week: 0 days    Minutes of Exercise per Session: Not on file  Stress: Stress Concern Present (11/28/2023)   [employer] of Occupational Health - Occupational Stress Questionnaire    Feeling of Stress: To some extent  Social Connections: Moderately Integrated (11/28/2023)   Social Connection and Isolation Panel    Frequency of Communication with Friends and Family: More than three times  a week    Frequency of Social Gatherings with Friends and Family: Patient declined    Attends Religious Services: More than 4 times per year    Active Member of [employer] or Organizations: No    Attends Engineer, Structural: Not on file    Marital Status: Married    Review of Systems Per HPI  Objective:  BP 136/73   Pulse (!) 116   Temp 98.2 F (36.8 C)   Ht 5' 2.75 (1.594 m)   Wt 121 lb (54.9 kg)   SpO2 99%   BMI 21.61 kg/m      11/28/2023    9:04 AM 02/03/2022    9:09 AM 03/28/2020    1:31 PM  BP/Weight  Systolic BP 136 116 122  Diastolic BP 73 78 74  Wt. (Lbs) 121 115.6 117  BMI 21.61 kg/m2 20.64 kg/m2 20.73 kg/m2    Physical Exam Vitals and nursing note reviewed.  Constitutional:      General: She is not in acute distress.     Appearance: Normal appearance.  HENT:     Head: Normocephalic and atraumatic.  Eyes:     General:        Right eye: No discharge.        Left eye: No discharge.     Conjunctiva/sclera: Conjunctivae normal.  Cardiovascular:     Rate and Rhythm: Normal rate and regular rhythm.  Pulmonary:     Effort: Pulmonary effort is normal.     Breath sounds: Normal breath sounds. No wheezing, rhonchi or rales.  Neurological:     Mental Status: She is alert.  Psychiatric:        Mood and Affect: Mood normal.        Behavior: Behavior normal.     Lab Results  Component Value Date   WBC 7.7 02/03/2022   HGB 14.1 02/03/2022   HCT 42.3 02/03/2022   PLT 262 02/03/2022   GLUCOSE 88 02/03/2022   CHOL 165 02/03/2022   TRIG 59 02/03/2022   HDL 56 02/03/2022   LDLCALC 97 02/03/2022   ALT 18 02/03/2022   AST 16 02/03/2022   NA 141 02/03/2022   K 4.2 02/03/2022   CL 105 02/03/2022   CREATININE 0.63 02/03/2022   BUN 11 02/03/2022   CO2 21 02/03/2022   TSH 1.630 03/12/2015   HGBA1C 4.9 03/12/2015     Assessment & Plan:  Annual physical exam Assessment & Plan: Labs today.  Health care maintenance section up dated.    Orders: -     CBC -     CMP14+EGFR -     Lipid panel  Stress reaction Assessment & Plan: We had a discussion today regarding counseling and potential pharmacotherapy.  She wishes to continue with just counseling and will reach out to us  if she needs additional help/pharmacotherapy.     Follow-up: Annually or sooner if needed.  Jacqulyn Ahle DO [facility]

## 2023-11-28 NOTE — Assessment & Plan Note (Signed)
 We had a discussion today regarding counseling and potential pharmacotherapy.  She wishes to continue with just counseling and will reach out to us  if she needs additional help/pharmacotherapy.

## 2023-11-28 NOTE — Assessment & Plan Note (Signed)
 Labs today.  Health care maintenance section up dated.

## 2023-11-29 ENCOUNTER — Ambulatory Visit: Payer: Self-pay | Admitting: Family Medicine

## 2023-11-29 LAB — LIPID PANEL
Chol/HDL Ratio: 2.7 ratio (ref 0.0–4.4)
Cholesterol, Total: 149 mg/dL (ref 100–199)
HDL: 55 mg/dL (ref >39)
LDL Chol Calc (NIH): 80 mg/dL (ref 0–99)
Triglycerides: 69 mg/dL (ref 0–149)
VLDL Cholesterol Cal: 14 mg/dL (ref 5–40)

## 2023-11-29 LAB — CMP14+EGFR
ALT: 12 IU/L (ref 0–32)
AST: 14 IU/L (ref 0–40)
Albumin: 4.5 g/dL (ref 3.9–4.9)
Alkaline Phosphatase: 79 IU/L (ref 41–116)
BUN/Creatinine Ratio: 14 (ref 9–23)
BUN: 9 mg/dL (ref 6–24)
Bilirubin Total: 0.6 mg/dL (ref 0.0–1.2)
CO2: 25 mmol/L (ref 20–29)
Calcium: 9.4 mg/dL (ref 8.7–10.2)
Chloride: 103 mmol/L (ref 96–106)
Creatinine, Ser: 0.64 mg/dL (ref 0.57–1.00)
Globulin, Total: 2.3 g/dL (ref 1.5–4.5)
Glucose: 89 mg/dL (ref 70–99)
Potassium: 4 mmol/L (ref 3.5–5.2)
Sodium: 140 mmol/L (ref 134–144)
Total Protein: 6.8 g/dL (ref 6.0–8.5)
eGFR: 112 mL/min/1.73 (ref >59)

## 2023-11-29 LAB — CBC
Hematocrit: 47.3 % — ABNORMAL HIGH (ref 34.0–46.6)
Hemoglobin: 16.1 g/dL — ABNORMAL HIGH (ref 11.1–15.9)
MCH: 33.8 pg — ABNORMAL HIGH (ref 26.6–33.0)
MCHC: 34 g/dL (ref 31.5–35.7)
MCV: 99 fL — ABNORMAL HIGH (ref 79–97)
Platelets: 285 x10E3/uL (ref 150–450)
RBC: 4.76 x10E6/uL (ref 3.77–5.28)
RDW: 12 % (ref 11.7–15.4)
WBC: 9.6 x10E3/uL (ref 3.4–10.8)

## 2023-12-09 ENCOUNTER — Other Ambulatory Visit: Payer: Self-pay | Admitting: Family Medicine

## 2023-12-09 MED ORDER — FLUOXETINE HCL 20 MG PO CAPS: 20.0000 mg | ORAL_CAPSULE | Freq: Every day | ORAL | 3 refills | Status: AC
